# Patient Record
Sex: Male | Born: 1995 | Race: White | Hispanic: No | Marital: Single | Smoking: Former smoker
Health system: Southern US, Community
[De-identification: ages and names within clinical notes are randomized; demographics above are authoritative.]

## PROBLEM LIST (undated history)

## (undated) DIAGNOSIS — F329 Major depressive disorder, single episode, unspecified: Secondary | ICD-10-CM

## (undated) DIAGNOSIS — F32A Depression, unspecified: Secondary | ICD-10-CM

## (undated) DIAGNOSIS — F319 Bipolar disorder, unspecified: Secondary | ICD-10-CM

## (undated) DIAGNOSIS — R001 Bradycardia, unspecified: Secondary | ICD-10-CM

## (undated) DIAGNOSIS — I491 Atrial premature depolarization: Secondary | ICD-10-CM

## (undated) DIAGNOSIS — F419 Anxiety disorder, unspecified: Secondary | ICD-10-CM

## (undated) DIAGNOSIS — J309 Allergic rhinitis, unspecified: Secondary | ICD-10-CM

## (undated) DIAGNOSIS — R002 Palpitations: Secondary | ICD-10-CM

## (undated) HISTORY — DX: Bradycardia, unspecified: R00.1

## (undated) HISTORY — DX: Atrial premature depolarization: I49.1

## (undated) HISTORY — PX: FRACTURE SURGERY: SHX138

## (undated) HISTORY — DX: Palpitations: R00.2

## (undated) HISTORY — DX: Allergic rhinitis, unspecified: J30.9

---
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
## 2016-04-02 ENCOUNTER — Ambulatory Visit
Admission: RE | Admit: 2016-04-02 | Discharge: 2016-04-02 | Disposition: A | Discharge status: Home / Self Care | Payer: BLUE CROSS/BLUE SHIELD | Source: Ambulatory Visit | Attending: Family Medicine | Admitting: Family Medicine

## 2016-04-02 ENCOUNTER — Other Ambulatory Visit: Payer: Self-pay | Admitting: Family Medicine

## 2016-04-02 DIAGNOSIS — T1490XA Injury, unspecified, initial encounter: Secondary | ICD-10-CM

## 2016-05-21 ENCOUNTER — Encounter (HOSPITAL_BASED_OUTPATIENT_CLINIC_OR_DEPARTMENT_OTHER): Payer: Self-pay

## 2016-05-21 ENCOUNTER — Emergency Department (HOSPITAL_BASED_OUTPATIENT_CLINIC_OR_DEPARTMENT_OTHER)
Admission: EM | Admit: 2016-05-21 | Discharge: 2016-05-21 | Disposition: A | Discharge status: Other Acute Inpatient Hospital | Payer: BLUE CROSS/BLUE SHIELD | Attending: Emergency Medicine | Admitting: Emergency Medicine

## 2016-05-21 ENCOUNTER — Encounter (HOSPITAL_COMMUNITY): Payer: Self-pay

## 2016-05-21 ENCOUNTER — Inpatient Hospital Stay (HOSPITAL_COMMUNITY)
Admission: AD | Admit: 2016-05-21 | Discharge: 2016-05-26 | DRG: 885 | Disposition: A | Discharge status: Home / Self Care | Payer: BLUE CROSS/BLUE SHIELD | Source: Intra-hospital | Attending: Psychiatry | Admitting: Psychiatry

## 2016-05-21 DIAGNOSIS — T1491 Suicide attempt: Secondary | ICD-10-CM | POA: Diagnosis not present

## 2016-05-21 DIAGNOSIS — T1491XA Suicide attempt, initial encounter: Secondary | ICD-10-CM

## 2016-05-21 DIAGNOSIS — F17210 Nicotine dependence, cigarettes, uncomplicated: Secondary | ICD-10-CM | POA: Diagnosis present

## 2016-05-21 DIAGNOSIS — R112 Nausea with vomiting, unspecified: Secondary | ICD-10-CM | POA: Diagnosis not present

## 2016-05-21 DIAGNOSIS — F411 Generalized anxiety disorder: Secondary | ICD-10-CM | POA: Diagnosis present

## 2016-05-21 DIAGNOSIS — F1410 Cocaine abuse, uncomplicated: Secondary | ICD-10-CM | POA: Diagnosis present

## 2016-05-21 DIAGNOSIS — Z79899 Other long term (current) drug therapy: Secondary | ICD-10-CM | POA: Insufficient documentation

## 2016-05-21 DIAGNOSIS — F329 Major depressive disorder, single episode, unspecified: Secondary | ICD-10-CM | POA: Diagnosis present

## 2016-05-21 DIAGNOSIS — F1210 Cannabis abuse, uncomplicated: Secondary | ICD-10-CM | POA: Diagnosis present

## 2016-05-21 DIAGNOSIS — T50901A Poisoning by unspecified drugs, medicaments and biological substances, accidental (unintentional), initial encounter: Secondary | ICD-10-CM | POA: Diagnosis present

## 2016-05-21 DIAGNOSIS — F322 Major depressive disorder, single episode, severe without psychotic features: Principal | ICD-10-CM | POA: Diagnosis present

## 2016-05-21 DIAGNOSIS — T50904A Poisoning by unspecified drugs, medicaments and biological substances, undetermined, initial encounter: Secondary | ICD-10-CM | POA: Diagnosis not present

## 2016-05-21 DIAGNOSIS — G4700 Insomnia, unspecified: Secondary | ICD-10-CM | POA: Diagnosis present

## 2016-05-21 DIAGNOSIS — R45851 Suicidal ideations: Secondary | ICD-10-CM | POA: Diagnosis present

## 2016-05-21 DIAGNOSIS — T424X2A Poisoning by benzodiazepines, intentional self-harm, initial encounter: Secondary | ICD-10-CM | POA: Diagnosis not present

## 2016-05-21 DIAGNOSIS — T405X2A Poisoning by cocaine, intentional self-harm, initial encounter: Secondary | ICD-10-CM | POA: Diagnosis not present

## 2016-05-21 HISTORY — DX: Anxiety disorder, unspecified: F41.9

## 2016-05-21 HISTORY — DX: Major depressive disorder, single episode, unspecified: F32.9

## 2016-05-21 HISTORY — DX: Depression, unspecified: F32.A

## 2016-05-21 HISTORY — DX: Bipolar disorder, unspecified: F31.9

## 2016-05-21 LAB — COMPREHENSIVE METABOLIC PANEL
ALBUMIN: 4.7 g/dL (ref 3.5–5.0)
ALK PHOS: 70 U/L (ref 38–126)
ALT: 15 U/L — ABNORMAL LOW (ref 17–63)
ANION GAP: 8 (ref 5–15)
AST: 21 U/L (ref 15–41)
BILIRUBIN TOTAL: 1 mg/dL (ref 0.3–1.2)
BUN: 13 mg/dL (ref 6–20)
CALCIUM: 9.3 mg/dL (ref 8.9–10.3)
CO2: 28 mmol/L (ref 22–32)
Chloride: 101 mmol/L (ref 101–111)
Creatinine, Ser: 0.99 mg/dL (ref 0.61–1.24)
GFR calc Af Amer: >60 mL/min (ref >60)
GFR calc non Af Amer: >60 mL/min (ref >60)
GLUCOSE: 99 mg/dL (ref 65–99)
POTASSIUM: 3.8 mmol/L (ref 3.5–5.1)
SODIUM: 137 mmol/L (ref 135–145)
TOTAL PROTEIN: 7.7 g/dL (ref 6.5–8.1)

## 2016-05-21 LAB — CBC
HEMATOCRIT: 42.1 % (ref 39.0–52.0)
HEMOGLOBIN: 14.5 g/dL (ref 13.0–17.0)
MCH: 28.7 pg (ref 26.0–34.0)
MCHC: 34.4 g/dL (ref 30.0–36.0)
MCV: 83.2 fL (ref 78.0–100.0)
Platelets: 225 10*3/uL (ref 150–400)
RBC: 5.06 MIL/uL (ref 4.22–5.81)
RDW: 13.2 % (ref 11.5–15.5)
WBC: 9.5 10*3/uL (ref 4.0–10.5)

## 2016-05-21 LAB — RAPID URINE DRUG SCREEN, HOSP PERFORMED
Amphetamines: NOT DETECTED
BARBITURATES: NOT DETECTED
BENZODIAZEPINES: NOT DETECTED
COCAINE: POSITIVE — AB
Opiates: NOT DETECTED
TETRAHYDROCANNABINOL: POSITIVE — AB

## 2016-05-21 LAB — ACETAMINOPHEN LEVEL

## 2016-05-21 LAB — ETHANOL: Alcohol, Ethyl (B): <5 mg/dL (ref <5)

## 2016-05-21 LAB — SALICYLATE LEVEL: Salicylate Lvl: <4 mg/dL (ref 2.8–30.0)

## 2016-05-21 MED ORDER — ALUM & MAG HYDROXIDE-SIMETH 200-200-20 MG/5ML PO SUSP: 30.0000 mL | ORAL | Status: DC | PRN

## 2016-05-21 MED ORDER — ACETAMINOPHEN 325 MG PO TABS: 650.0000 mg | ORAL_TABLET | Freq: Four times a day (QID) | ORAL | Status: DC | PRN

## 2016-05-21 MED ORDER — HYDROXYZINE HCL 25 MG PO TABS
25.0000 mg | ORAL_TABLET | Freq: Four times a day (QID) | ORAL | Status: DC | PRN
  Administered 2016-05-22 – 2016-05-25 (×4): 25 mg via ORAL
  Filled 2016-05-21 (×4): qty 1

## 2016-05-21 MED ORDER — ENSURE ENLIVE PO LIQD
237.0000 mL | Freq: Two times a day (BID) | ORAL | Status: DC
  Administered 2016-05-22 – 2016-05-25 (×5): 237 mL via ORAL

## 2016-05-21 MED ORDER — MAGNESIUM HYDROXIDE 400 MG/5ML PO SUSP: 30.0000 mL | Freq: Every day | ORAL | Status: DC | PRN

## 2016-05-21 NOTE — Tx Team (Signed)
Initial Interdisciplinary Treatment Plan   PATIENT STRESSORS: Educational concerns Substance abuse   PATIENT STRENGTHS: Wellsite geologistCommunication skills General fund of knowledge Physical Health   PROBLEM LIST: Problem List/Patient Goals Date to be addressed Date deferred Reason deferred Estimated date of resolution  Depression 05/21/16     Anxiety 05/21/16     Substance abuse 05/21/16     "I want to be able to balance my life and not have it revolve around drugs" 05/21/16     "I really don't want to stop smoking weed but just smoke less" 05/21/16                              DISCHARGE CRITERIA:  Improved stabilization in mood, thinking, and/or behavior Verbal commitment to aftercare and medication compliance  PRELIMINARY DISCHARGE PLAN: Outpatient therapy Medication management  PATIENT/FAMIILY INVOLVEMENT: This treatment plan has been presented to and reviewed with the patient, Mark Kirk.  The patient and family have been given the opportunity to ask questions and make suggestions.  Norm ParcelHeather V Markee Matera 05/21/2016, 10:36 PM

## 2016-05-21 NOTE — Progress Notes (Signed)
Mark Kirk is a 20 y.o. male being admitted involuntarily to [facility] from  [facility].  He has recently been diagnosed with bipolar disorder and not taking medication.  He recently  just quit his job and dropped out of school because he could no longer handle the pressure.  Pt stated also that his sadness and anxiety were so severe that on or about 05/20/16, he overdosed on [address]-bought Klonopin pills and also smoked marijuana and used cocaine.  This is his first suicide attempt but stated that he just couldn't handle things and didn't really want to die.  Pt stated that he feels sad, anxious, has poor appetite, anhedonia, and feelings of hopelessness.  Pt also endorsed daily use of marijuana and rare use of cocaine and alcohol.  He refused to sign in voluntarily and had to be IVC'd.  Admission paperwork completed and signed.  Pt came in with no belongings.  Skin assessment completed and noted right hand surgery scar and facial acne.  Q 15 minute checks initiated for safety.  We will monitor the progress towards his goals.

## 2016-05-21 NOTE — ED Notes (Signed)
[facility] Tor[facility]; [facility] notified for transport

## 2016-05-21 NOTE — ED Notes (Signed)
Gave mom and dad visiting hours and rule about pt needing to give them a code to visit and they verbalize understanding.   Oneal GroutDad, Todd: [phone] Milon DikesMom, Sandra: [phone] Pt's psychiatrist Dr. Len Blalockavid Fuller: [phone] And counselor Loralie ChampagneAmanda Kirby: [phone]

## 2016-05-21 NOTE — ED Notes (Signed)
Pt [facility]

## 2016-05-21 NOTE — ED Notes (Signed)
Mom informed nurse that pt has a recent diagnosis of bipolar disorder and was put on medication, but elected to stop the medication over a week ago.

## 2016-05-21 NOTE — ED Notes (Signed)
Pt wanded by security. 

## 2016-05-21 NOTE — ED Notes (Signed)
[facility]

## 2016-05-21 NOTE — ED Notes (Signed)
Dr. Cyndie ChimeNguyen at bedside to assess patient. Pt refused to speak to doctor after being informed he was being admitted

## 2016-05-21 NOTE — BH Assessment (Signed)
[facility] Assessment Progress Note     [facility] once medically cleared as indicated in notes.

## 2016-05-21 NOTE — ED Notes (Signed)
MD at bedside. 

## 2016-05-21 NOTE — ED Notes (Addendum)
Patient complains of ongoing depression and on Monday took Klonopin 20 tablets and powder cocaine in attempt to hurt himself. Has had similar thoughts in past.  Denies any ingestion today. Alert with flat affect. Has vomited several times since monday

## 2016-05-21 NOTE — ED Provider Notes (Signed)
CSN: [id number]     Arrival date & time 05/21/16  1506 History   First MD Initiated Contact with Patient 05/21/16 1524     Chief Complaint  Patient presents with  . Suicidal  . Ingestion    HPI Comments: Patient presents with complaint of active suicidal ideation. Patient states that he has been having thoughts of hurting himself and depression for greater than one week. Two days ago patient states that he intentionally took 20 Klonopin tablets of unknown dosage as well as cocaine and attempt to harm himself. Patient states that he slept for a while after taking these and then woke up and vomited several times. He did not combine this with alcohol. No ingestions yesterday or today. Patient otherwise does not voice any medical complaints. He states that he thinks he has tried to harm himself in the past but cannot give details regarding when or how. He states that he wants help with everything that he is going through.   Patient is a 20 y.o. male presenting with Ingested Medication. The history is provided by the patient.  Ingestion Associated symptoms include nausea and vomiting. Pertinent negatives include no abdominal pain, chest pain, coughing, fever, headaches, myalgias, rash or sore throat.    Past Medical History  Diagnosis Date  . Depression   . Bipolar 1 disorder (HCC)    History reviewed. No pertinent past surgical history. No family history on file. Social History  Substance Use Topics  . Smoking status: Current Every Day Smoker    Types: Cigarettes  . Smokeless tobacco: None  . Alcohol Use: Yes    Review of Systems  Constitutional: Negative for fever.  HENT: Negative for rhinorrhea and sore throat.   Eyes: Negative for redness.  Respiratory: Negative for cough.   Cardiovascular: Negative for chest pain.  Gastrointestinal: Positive for nausea and vomiting. Negative for abdominal pain and diarrhea.  Genitourinary: Negative for dysuria.  Musculoskeletal: Negative for  myalgias.  Skin: Negative for rash.  Neurological: Negative for headaches.  Psychiatric/Behavioral: Positive for suicidal ideas.    Allergies  Review of patient's allergies indicates no known allergies.  Home Medications   Prior to Admission medications   Medication Sig Start Date End Date Taking? Authorizing Provider  risperiDONE (RISPERDAL) 0.5 MG tablet Take 0.5 mg by mouth 2 (two) times daily.   Yes Historical Provider, MD   BP 123/76 mmHg  Pulse 64  Temp(Src) 98.6 F (37 C) (Oral)  Resp 19  Ht  (1.753 m)  Wt 68.04 kg  BMI 22.14 kg/m2  SpO2 100%   Physical Exam  Constitutional: He appears well-developed and well-nourished.  HENT:  Head: Normocephalic and atraumatic.  Mouth/Throat: Oropharynx is clear and moist.  Eyes: Conjunctivae are normal. Right eye exhibits no discharge. Left eye exhibits no discharge.  Neck: Normal range of motion. Neck supple.  Cardiovascular: Normal rate, regular rhythm and normal heart sounds.   Pulmonary/Chest: Effort normal and breath sounds normal. No respiratory distress. He has no wheezes. He has no rales.  Abdominal: Soft. There is no tenderness.  Neurological: He is alert.  Skin: Skin is warm and dry.  Psychiatric: He has a normal mood and affect.  Nursing note and vitals reviewed.   ED Course  Procedures (including critical care time) Labs Review Labs Reviewed  URINE RAPID DRUG SCREEN, HOSP PERFORMED - Abnormal; Notable for the following:    Cocaine POSITIVE (*)    Tetrahydrocannabinol POSITIVE (*)    All other components within normal  limits  CBC  COMPREHENSIVE METABOLIC PANEL  ETHANOL  SALICYLATE LEVEL  ACETAMINOPHEN LEVEL     3:34 PM Patient seen and examined. Work-up initiated. Holding orders completed. Will need TTS eval. Patient is currently voluntary.   Vital signs reviewed and are as follows: BP 123/76 mmHg  Pulse 64  Temp(Src) 98.6 F (37 C) (Oral)  Resp 19  Ht 5\' 9"  (1.753 m)  Wt 68.04 kg  BMI 22.14  kg/m2  SpO2 100%  4:05 PM Handoff to Dr. Cyndie ChimeNguyen at shift change. Pending TTS eval.   MDM   Final diagnoses:  Suicide attempt [facility](HCC)   Pending TTS eval.    Renne CriglerJoshua Ashlley Booher, PA-C 05/24/16 2007  Leta BaptistEmily Roe Nguyen, MD 05/26/16 2314

## 2016-05-21 NOTE — ED Notes (Addendum)
TTS consult in process via telephone with [facility]

## 2016-05-21 NOTE — ED Notes (Signed)
PA at bedside.

## 2016-05-21 NOTE — BH Assessment (Signed)
Tele Assessment Note   Mark Kirk is a 20 y.o. male who presented to [facility] on a voluntary basis after intentionally overdosing on about [address]-purchased Klonipin with intent to "really hurt myself or die."  The assessment was conducted by phone as the telepsych machine could not be activated.  As a result, appearance, motor activity, eye contact could not be evaluated.  Pt reported as follows:  "For as long as I've been alive, I've had anxiety."  Pt stated that he was, until recently, enrolled at [employer] and employed at [employer].  Pt said he just quit his job and dropped out of school because he could no longer handle the pressure.  Pt stated also that his sadness and anxiety were so severe that on or about 05/20/16, he overdosed on [address]-bought Klonipin pills and also smoked marijuana and used cocaine.  Per Pt, this is his first suicide attempt.  "I don't really know if I was trying to kill myself, but I really was trying to hurt myself real bad."  Pt stated that he feels sad, anxious, has poor appetite, improving sleep ("I used to not be able to sleep at all"), loss of enjoyment/pleasure, and feelings of hopelessness.  Pt also endorsed daily use of marijuana and rare use of cocaine and alcohol.  In addition to his overdose, Pt reported a history of self-injury, including a recent episode of punching a cooler and breaking his wrist.  When asked about triggers for his overdose, Pt reported, "A whole lot of internal triggers ... Just the way I feel."  Pt lives with his parents.  He reported that he takes Risperidone prescribed by his psychiatrist, Dr. Toni Arthurs.  He denied any previous hospitalizations.  During assessment, Pt was cooperative.  He reported mood as depressed and affect was congruent.  Pt endorsed suicidal ideation (reckless ideation to dying combined with desire to cause significant injury) and other depressive symptoms (see above).  Pt also endorsed constant anxiety with panic  attacks.  Pt endorsed daily use of marijuana and rare use of cocaine and alcohol.  Speech was normal in rate, rhythm, and volume.  Memory and concentration were intact.  Thought processes were within normal limits and thought content was consistent with suicidal ideation.  Impulse control, judgment, and insight were deemed poor as evidenced by recent overdose.  Consulted with A. Nwoko, who determined that Pt meets inpatient criteria.  Consulted with [facility] RN -- Pt to be admitted to [facility] 404-1 once medically cleared.   Diagnosis: Major Depressive Disorder, Single Ep., Severe, with Anxious Features  Past Medical History:  Past Medical History  Diagnosis Date  . Depression   . Bipolar 1 disorder (HCC)   . Anxiety     History reviewed. No pertinent past surgical history.  Family History: No family history on file.  Social History:  reports that he has been smoking Cigarettes.  He does not have any smokeless tobacco history on file. He reports that he drinks alcohol. He reports that he uses illicit drugs (Cocaine and Marijuana).  Additional Social History:  Alcohol / Drug Use Pain Medications: See PTA Prescriptions: See PTA Over the Counter: See PTA History of alcohol / drug use?: Yes Substance #1 Name of Substance 1: Marijuana 1 - Age of First Use: 15 1 - Amount (size/oz): 1/8 gram 1 - Frequency: Daily 1 - Duration: Ongoing 1 - Last Use / Amount: 05/20/16 Substance #2 Name of Substance 2: Cocaine 2 - Amount (size/oz): "a bump"  2 - Frequency: Rarely 2 - Last Use / Amount: 05/20/16 Substance #3 Name of Substance 3: Alcohol 3 - Amount (size/oz): "a couple of beers" 3 - Last Use / Amount: 05/20/16  CIWA: CIWA-Ar BP: 123/76 mmHg Pulse Rate: 64 COWS:    PATIENT STRENGTHS: (choose at least two) Average or above average intelligence Capable of independent living Communication skills  Allergies: No Known Allergies  Home Medications:  (Not in a hospital admission)  OB/GYN Status:   No LMP for male patient.  General Assessment Data [facility] TTS Assessment: In system Is this a Tele or Face-to-Face Assessment?: Tele Assessment Is this an Initial Assessment or a Re-assessment for this encounter?: Initial Assessment Marital status: Married Is patient pregnant?: No Pregnancy Status: No Living Arrangements: Parent Can pt return to current living arrangement?: Yes Admission Status: Voluntary Is patient capable of signing voluntary admission?: Yes Referral Source: Self/Family/Friend Insurance type: BCBS  Medical Screening Exam [facility] Walk[facility]in ONLY) Medical Exam completed: Yes  Crisis Care Plan Living Arrangements: Parent Name of Psychiatrist: Dr. Toni ArthursFuller Name of Therapist: Loralie Champagnemanda Kirby  Education Status Is patient currently in school?: No  Risk to self with the past 6 months Suicidal Ideation: Yes-Currently Present Has patient been a risk to self within the past 6 months prior to admission? : No Suicidal Intent: Yes-Currently Present Has patient had any suicidal intent within the past 6 months prior to admission? : No Is patient at risk for suicide?: Yes Suicidal Plan?: Yes-Currently Present Has patient had any suicidal plan within the past 6 months prior to admission? : No Specify Current Suicidal Plan: Pt overdosed on 20 Kl[address] on [address] (Also used THC, Cocaine) Access to Means: Yes Specify Access to Suicidal Means: Bought Klonipin off street What has been your use of drugs/alcohol within the last 12 months?: Cocaine, Marijuana, Alcohol Previous Attempts/Gestures: No Other Self Harm Risks: Drug use, self-injury Intentional Self Injurious Behavior: Damaging Comment - Self Injurious Behavior: Pt punched cooler, breaking wrist Family Suicide History: Unknown Recent stressful life event(s): Turmoil (Comment) (Overwhelmed at work and school) Persecutory voices/beliefs?: No Depression:  Yes Depression Symptoms: Despondent, Insomnia, Feeling worthless/self pity Substance abuse history and/or treatment for substance abuse?: Yes Suicide prevention information given to non-admitted patients: Not applicable  Risk to Others within the past 6 months Homicidal Ideation: No Does patient have any lifetime risk of violence toward others beyond the six months prior to admission? : No Thoughts of Harm to Others: No Current Homicidal Intent: No Current Homicidal Plan: No Access to Homicidal Means: No History of harm to others?: No Assessment of Violence: None Noted Does patient have access to weapons?: No Criminal Charges Pending?: No Does patient have a court date: No Is patient on probation?: No  Psychosis Hallucinations: None noted Delusions: None noted  Mental Status Report Appearance/Hygiene: Unable to Assess Eye Contact: Unable to Assess Motor Activity: Unable to assess Speech: Logical/coherent, Unremarkable Level of Consciousness: Alert Mood: Depressed Affect: Appropriate to circumstance Anxiety Level: Moderate Thought Processes: Coherent, Relevant Judgement: Impaired Orientation: Place, Person, Time, Situation Obsessive Compulsive Thoughts/Behaviors: None  Cognitive Functioning Concentration: Normal Memory: Recent Intact, Remote Intact IQ: Average Insight: Poor Impulse Control: Poor Appetite: Poor Sleep: No Change Vegetative Symptoms: Staying in bed  ADLScreening [facility] Assessment Services) Patient's cognitive ability adequate to safely complete daily activities?: Yes Patient able to express need for assistance with ADLs?: Yes Independently performs ADLs?: Yes (appropriate for developmental age)  Prior Inpatient Therapy Prior Inpatient  Therapy: No  Prior Outpatient Therapy Prior Outpatient Therapy: Yes Prior Therapy Dates: Ongoing Prior Therapy Facilty/Provider(s): Loralie Champagne Reason for Treatment: Depression, Anxiety Does patient have an ACCT  team?: No Does patient have Intensive In-House Services?  : No Does patient have [facility] services? : No Does patient have P4CC services?: No  ADL Screening (condition at time of admission) Patient's cognitive ability adequate to safely complete daily activities?: Yes Is the patient deaf or have difficulty hearing?: No Does the patient have difficulty seeing, even when wearing glasses/contacts?: No Does the patient have difficulty concentrating, remembering, or making decisions?: No Patient able to express need for assistance with ADLs?: Yes Does the patient have difficulty dressing or bathing?: No Independently performs ADLs?: Yes (appropriate for developmental age) Does the patient have difficulty walking or climbing stairs?: No Weakness of Legs: None Weakness of Arms/Hands: None  Home Assistive Devices/Equipment Home Assistive Devices/Equipment: None  Therapy Consults (therapy consults require a physician order) PT Evaluation Needed: No OT Evalulation Needed: No SLP Evaluation Needed: No Abuse/Neglect Assessment (Assessment to be complete while patient is alone) Physical Abuse: Denies Verbal Abuse: Denies Sexual Abuse: Denies Exploitation of patient/patient's resources: Denies Self-Neglect: Denies Values / Beliefs Cultural Requests During Hospitalization: None Spiritual Requests During Hospitalization: None Consults Spiritual Care Consult Needed: No Social Work Consult Needed: No Merchant navy officer (For Healthcare) Does patient have an advance directive?: No Would patient like information on creating an advanced directive?: No - patient declined information    Additional Information 1:1 In Past 12 Months?: No CIRT Risk: No Elopement Risk: No Does patient have medical clearance?: Yes     Disposition:  Disposition Initial Assessment Completed for this Encounter: Yes Disposition of Patient: Inpatient treatment program Type of inpatient treatment program: Adult (Per  A Nwoko, Pt meets inpt criteria)  Dorris Fetch Neely Cecena 05/21/2016 5:24 PM

## 2016-05-21 NOTE — ED Provider Notes (Signed)
Patient seen and evaluated at bedside. Patient reporting that he took medications on purpose to harm himself and that he knew that this could kill him. Patient medically cleared for inpatient psychiatric treatment. When patient was told he would be going for inpatient care he said that he did not want to go and was refusing to go. He did acquiesce but only and said that he would because he was stuck. There was concern that if he was transported without an IVC in place that he may try to take off and not go to the inpatient psych facility. For this reason IVC paperwork was filled out for the patient's safety.  Leta BaptistEmily Roe Louine Tenpenny, MD 05/21/16 [phone]

## 2016-05-22 ENCOUNTER — Encounter (HOSPITAL_COMMUNITY): Payer: Self-pay | Admitting: Psychiatry

## 2016-05-22 DIAGNOSIS — T50904A Poisoning by unspecified drugs, medicaments and biological substances, undetermined, initial encounter: Secondary | ICD-10-CM

## 2016-05-22 DIAGNOSIS — T50901A Poisoning by unspecified drugs, medicaments and biological substances, accidental (unintentional), initial encounter: Secondary | ICD-10-CM | POA: Diagnosis present

## 2016-05-22 DIAGNOSIS — F329 Major depressive disorder, single episode, unspecified: Secondary | ICD-10-CM

## 2016-05-22 MED ORDER — NICOTINE POLACRILEX 2 MG MT GUM
2.0000 mg | CHEWING_GUM | OROMUCOSAL | Status: DC | PRN
  Administered 2016-05-22 – 2016-05-25 (×8): 2 mg via ORAL
  Filled 2016-05-22: qty 1
  Filled 2016-05-22: qty 5
  Filled 2016-05-22 (×4): qty 1

## 2016-05-22 MED ORDER — TRAZODONE HCL 50 MG PO TABS
50.0000 mg | ORAL_TABLET | Freq: Every evening | ORAL | Status: DC | PRN
  Administered 2016-05-22 – 2016-05-25 (×4): 50 mg via ORAL
  Filled 2016-05-22 (×3): qty 1

## 2016-05-22 MED ORDER — CITALOPRAM HYDROBROMIDE 10 MG PO TABS
10.0000 mg | ORAL_TABLET | Freq: Every day | ORAL | Status: DC
  Administered 2016-05-22 – 2016-05-26 (×5): 10 mg via ORAL
  Filled 2016-05-22 (×8): qty 1

## 2016-05-22 NOTE — BHH Counselor (Signed)
Adult Comprehensive Assessment  Patient ID: Mark Kirk, male   DOB: 07/03/1996, 20 y.o.   MRN: [id number]  Information Source: Information source: Patient  Current Stressors:  Educational / Learning stressors:  (Completed 12th grade and 6 months of [employer]. ) Employment / Job issues:  (Recently lost his jib at [employer] which caused a great deal of stress) Family Relationships:  (Relationships with family has been strained due to drug use) Surveyor, quantityinancial / Lack of resources (include bankruptcy):  (Only responsible for gas in vehicle and phone bill) Housing / Lack of housing:  (Lives with both parents) Physical health (include injuries & life threatening diseases):  (Recently broke his wrist) Social relationships:  (Has 4 strong frienships outside of his family) Substance abuse:  (Chronic marijuana use) Bereavement / Loss:  (N/A)  Living/Environment/Situation:  Living Arrangements: Parent Living conditions (as described by patient or guardian):  (Family dynamic has gotten better, therapy has helped strengthen relationships) How long has patient lived in current situation?: Since birth, 20 years What is atmosphere in current home: Loving, Supportive  Family History:  Marital status: Single (Single) What is your sexual orientation?:  (Straight) Has your sexual activity been affected by drugs, alcohol, medication, or emotional stress?:  (N/A) Does patient have children?: No  Childhood History:  By whom was/is the patient raised?: Both parents Additional childhood history information:  (N/A) Description of patient's relationship with caregiver when they were a child:  (Always had a very close relationship with family members. Dad worked a lot when pt was a child but still active. Mom stayed at home and helped around the house and for sporting events.) Patient's description of current relationship with people who raised him/her: Family is closer now, they are able to talk about situations that were  once chaotic or hard to discuss. How were you disciplined when you got in trouble as a child/adolescent?: Had things taken away, put on punishment  Does patient have siblings?: Yes Number of Siblings: 1 Description of patient's current relationship with siblings: Sister and pt has an extremely close reationship Did patient suffer any verbal/emotional/physical/sexual abuse as a child?: No Did patient suffer from severe childhood neglect?: No Has patient ever been sexually abused/assaulted/raped as an adolescent or adult?: No Was the patient ever a victim of a crime or a disaster?: No Witnessed domestic violence?: No Has patient been effected by domestic violence as an adult?: No  Education:  Highest grade of school patient has completed:  (Completed 12th grade and a semester of college) Currently a Consulting civil engineerstudent?: No Learning disability?: No  Employment/Work Situation:   Employment situation: Unemployed Patient's job has been impacted by current illness: No What is the longest time patient has a held a job?:  (One year and a half) Where was the patient employed at that time?: [employer] Has patient ever been in the Eli Lilly and Companymilitary?: No Has patient ever served in combat?: No Did You Receive Any Psychiatric Treatment/Services While in Equities traderthe Military?: No Are There Guns or Other Weapons in Your Home?: No Are These ComptrollerWeapons Safely Secured?: Yes (N/A) Who Could Verify You Are Able To Have These Secured:: N/A  Financial Resources:   Financial resources: Support from parents / caregiver Does patient have a Lawyerrepresentative payee or guardian?:  (Mother and father are considered pt's guardian)  Alcohol/Substance Abuse:   What has been your use of drugs/alcohol within the last 12 months?:  (Cocaine (2-3 times), Marijuana (daily), acid (3 times), drink beer (once a week), xanax (7 or 8 times))  If attempted suicide, did drugs/alcohol play a role in this?: Yes Alcohol/Substance Abuse Treatment Hx: Denies past  history If yes, describe treatment:  (N/A) Has alcohol/substance abuse ever caused legal problems?: Yes (Was caught selling marijuana but was not charged )  Social Support System:   Patient's Community Support System: Production assistant, radio System:  (Has supportive neighbors and friends in the community) Type of faith/religion:  (N/A) How does patient's faith help to cope with current illness?:  (N/A)  Leisure/Recreation:   Leisure and Hobbies: Use to play soccer, enjoys listening to music  Strengths/Needs:   What things does the patient do well?: Rapping, soccer, good with hands  In what areas does patient struggle / problems for patient: Procrastination in school and responsibilities. Internal struggle such as mood swings  Discharge Plan:   Does patient have access to transportation?: Yes Will patient be returning to same living situation after discharge?: Yes Currently receiving community mental health services: Yes (From Whom) (Dr. Toni Arthurs and Loralie Champagne at [place] Does patient have financial barriers related to discharge medications?: No  Summary/Recommendations:   Summary and Recommendations (to be completed by the evaluator): Mark Kirk is a 20 y.o, caucasian male diagnosed with Major Depressive Disorder, Single Ep., Severe, with Anxious Features. [name] was admitted on a voluntary basis after intentionally overdosing on about 20 street-purchased Klonipin with intent to "really hurt myself or die." Patient has a therapist and psychiatrist that he sees on a regular basis and will continue treatment with that agency at discharge. Patient will benefit from crisis stabilization,medication evaluation, group therapy, and psycho education in addition to case management for discharge planning. Patient and CSW reviewed pt's identified goals and treatment plan. Pt verbalized understanding and agreed to treatment plan. At discharge it is recommended that patient  remain compliant with established plan and continue treatment.  Mark Gerald B. 05/22/2016

## 2016-05-22 NOTE — Progress Notes (Signed)
Patient ID: Nile Riggsndrew Toor, male   DOB: 10/25/1996, 20 y.o.   MRN: [id number]  Adult Psychoeducational Group Note  Date:  05/22/2016 Time:  09:00am  Group Topic/Focus:  Crisis Planning:   The purpose of this group is to help patients create a crisis plan for use upon discharge or in the future, as needed.  Participation Level:  Did Not Attend  Participation Quality:  n/a  Affect:  n/a  Cognitive: n/a  Insight: n/a  Engagement in Group: n/a  Modes of Intervention:  Activity, Discussion, Education and Support  Additional Comments:  Pt did not attend group, pt in bed asleep.   Aurora Maskwyman, Pio Eatherly E 05/22/2016, 9:58 AM

## 2016-05-22 NOTE — B[facility] Suicide Risk Assessment (Signed)
[facility]   Nursing information obtained from:  Patient Demographic factors:  Male, Caucasian, Unemployed Current Mental Status:  Self-harm behaviors Loss Factors:  Decrease in vocational status Historical Factors:  Prior suicide attempts, Family history of mental illness or substance abuse, Impulsivity Risk Reduction Factors:  Living with another person, especially a relative, Positive social support  Total Time spent with patient: 45 minutes Principal Problem: Depression , Substance Abuse  Diagnosis:   Patient Active Problem List   Diagnosis Date Noted  . Major depressive disorder (HCC) [F32.9] 05/21/2016     Continued Clinical Symptoms:  Alcohol Use Disorder Identification Test Final Score (AUDIT): 3 The "Alcohol Use Disorders Identification Test", Guidelines for Use in Primary Care, Second Edition.  World Science writerHealth Organization [facility](WHO). Score between 0-7:  no or low risk or alcohol related problems. Score between 8-15:  moderate risk of alcohol related problems. Score between 16-19:  high risk of alcohol related problems. Score 20 or above:  warrants further diagnostic evaluation for alcohol dependence and treatment.   CLINICAL FACTORS:  20 year old single male,  Reports history of depression, substance abuse, and states he feels he has not been doing well recently. Recently withdrew from college and quit his job. Reports family has been concerned about him, his level of functioning . He recently  Impulsively  Overdosed   on BZD and Cocaine .      Psychiatric Specialty Exam: Physical Exam  ROS  Blood pressure 117/89, pulse 60, temperature 98.5 F (36.9 C), temperature source Oral, resp. rate 16, height 5\' 9"  (1.753 m), weight 150 lb (68.04 kg), SpO2 100 %.Body mass index is 22.14 kg/(m^2).   see admit note MSE    COGNITIVE FEATURES THAT CONTRIBUTE TO RISK:  Decrease in level of functioning   SUICIDE RISK:   Moderate:  Frequent suicidal  ideation with limited intensity, and duration, some specificity in terms of plans, no associated intent, good self-control, limited dysphoria/symptomatology, some risk factors present, and identifiable protective factors, including available and accessible social support.  PLAN OF CARE: Patient will be admitted to inpatient psychiatric unit for stabilization and safety. Will provide and encourage milieu participation. Provide medication management and maked adjustments as needed.  Will follow daily.    I certify that inpatient services furnished can reasonably be expected to improve the patient's condition.   Nehemiah MassedOBOS, FERNANDO, MD 05/22/2016, 3:49 PM

## 2016-05-22 NOTE — BHH Group Notes (Signed)
BHH LCSW Group Therapy  05/22/2016 3:35 PM  Type of Therapy:  Group Therapy  Participation Level:  Active  Participation Quality:  Appropriate and Attentive  Affect:  Appropriate  Cognitive:  Alert and Appropriate  Insight:  Developing/Improving  Engagement in Therapy:  Developing/Improving  Modes of Intervention:  Discussion, Education, Exploration and Support  Summary of Progress/Problems:  [facility] Speaker came to talk about his personal journey with substance abuse and addiction. The pt processed ways by which to relate to the speaker. [facility] speaker provided handouts and educational information pertaining to groups and services offered by the [facility].   Ebb Carelock C 05/22/2016, 3:35 PM   

## 2016-05-22 NOTE — Progress Notes (Signed)
Nutrition Brief Note  Patient identified on the Malnutrition Screening Tool (MST) Report  Wt Readings from Last 15 Encounters:  05/21/16 150 lb (68.04 kg) (44 %*, Z = -0.15)  05/21/16 150 lb (68.04 kg) (44 %*, Z = -0.15)   * Growth percentiles are based on CDC 2-20 Years data.    Body mass index is 22.14 kg/(m^2). Patient meets criteria for normal range based on current BMI.   Diet Order: Diet regular Room service appropriate?: Yes; Fluid consistency:: Thin Labs and medications reviewed.   No nutrition interventions warranted at this time. If nutrition issues arise, please consult RD.   Tilda FrancoLindsey Veria Stradley, MS, RD, LDN Pager: [phone] After Hours Pager: [phone]

## 2016-05-22 NOTE — Progress Notes (Signed)
Mark Kirk rates Anxiety 6/10 and Depression 8/10. His goal today "was to not go insane from being confined within these white walls". Denies SI/HI/AVH. Encouragement and support given. Medications administered as prescribed. Continue Q 15 minute checks for patient safety and medication effectiveness.

## 2016-05-22 NOTE — BHH Suicide Risk Assessment (Signed)
BHH INPATIENT:  Family/Significant Other Suicide Prevention Education  Suicide Prevention Education:  Education Completed: Mom, Dois DavenportSandra [phone]) has been identified by the patient as the family member/significant other with whom the patient will be residing, and identified as the person(s) who will aid the patient in the event of a mental health crisis (suicidal ideations/suicide attempt).  With written consent from the patient, the family member/significant other has been provided the following suicide prevention education, prior to the and/or following the discharge of the patient.  The suicide prevention education provided includes the following:  Suicide risk factors  Suicide prevention and interventions  National Suicide Hotline telephone number  [facility] assessment telephone number  [facility] Emergency Assistance 911  [facility] and/or Residential Mobile Crisis Unit telephone number  Request made of family/significant other to:  Remove weapons (e.g., guns, rifles, knives), all items previously/currently identified as safety concern.    Remove drugs/medications (over-the-counter, prescriptions, illicit drugs), all items previously/currently identified as a safety concern.  The family member/significant other verbalizes understanding of the suicide prevention education information provided.  The family member/significant other agrees to remove the items of safety concern listed above.  Daryel Geraldorth, Alok Minshall B 05/22/2016, 3:53 PM

## 2016-05-22 NOTE — Tx Team (Signed)
Interdisciplinary Treatment Plan Update (Adult)  Date:  05/22/2016   Time Reviewed:  8:24 AM   Progress in Treatment: Attending groups: Yes. Participating in groups:  Yes. Taking medication as prescribed:  Yes. Tolerating medication:  Yes. Family/Significant other contact made:  Yes Mother Patient understands diagnosis:  Yes  As evidenced by seeking help with anxiety and substance abuse Discussing patient identified problems/goals with staff:  Yes, see initial care plan. Medical problems stabilized or resolved:  Yes. Denies suicidal/homicidal ideation: Yes. Issues/concerns per patient self-inventory:  No. Other:  New problem(s) identified:  Discharge Plan or Barriers: see below  Reason for Continuation of Hospitalization: Anxiety Suicidal ideation  Comments: Patient complains of ongoing depression and on Monday took Klonopin 20 tablets and powder cocaine in attempt to hurt himself. Has had similar thoughts in past.  We discussed options, agrees to antidepressant trial, start CELEXA 10 mgrs QDAY initially, side effects discussed   Estimated length of stay: 3-5 days  New goal(s):  Review of initial/current patient goals per problem list:   Review of initial/current patient goals per problem list:  1. Goal(s): Patient will participate in aftercare plan   Met: Yes   Target date: 3-5 days post admission date   As evidenced by: Patient will participate within aftercare plan AEB aftercare provider and housing plan at discharge being identified.  05/22/2016: Patient will return home with mother and follow up with current providers.  2. Goal (s): Patient will exhibit decreased depressive symptoms and suicidal ideations.   Met: No   Target date: 3-5 days post admission date   As evidenced by: Patient will utilize self rating of depression at 3 or below and demonstrate decreased signs of depression or be deemed stable for discharge by MD.  05/22/2016: Patient still  presents with depression symptoms. Depression rating score at a 6.   3. Goal(s): Patient will demonstrate decreased signs and symptoms of anxiety.   Met: No   Target date: 3-5 days post admission date   As evidenced by: Patient will utilize self rating of anxiety at 3 or below and demonstrated decreased signs of anxiety, or be deemed stable for discharge by MD  05/22/2016: Anxiety symptoms still present at this time. Anxiety score at a 7.   4. Goal(s): Patient will demonstrate decreased signs of withdrawal due to substance abuse   Met: Yes   Target date: 3-5 days post admission date   As evidenced by: Patient will produce a CIWA/COWS score of 0, have stable vitals signs, and no symptoms of withdrawal 05/22/16:  No signs nor symptoms of withdrawal today    Attendees: Patient:  05/22/2016 8:24 AM   Family:   05/22/2016 8:24 AM   Physician:  Ursula Alert, MD 05/22/2016 8:24 AM   Nursing:   Darrol Angel, RN 05/22/2016 8:24 AM   CSW:    Roque Lias, LCSW   05/22/2016 8:24 AM   Other:  05/22/2016 8:24 AM   Other:   05/22/2016 8:24 AM   Other:  Lars Pinks, Nurse CM 05/22/2016 8:24 AM   Other:   05/22/2016 8:24 AM   Other:  Norberto Sorenson, [place]  05/22/2016 8:24 AM   Other:  05/22/2016 8:24 AM   Other:  05/22/2016 8:24 AM   Other:  05/22/2016 8:24 AM   Other:  05/22/2016 8:24 AM   Other:  05/22/2016 8:24 AM   Other:   05/22/2016 8:24 AM    Scribe for Treatment Team:   Trish Mage, 05/22/2016 8:24 AM

## 2016-05-22 NOTE — BHH Counselor (Signed)
Adult Comprehensive Assessment  Patient ID: Mark Kirk, male   DOB: 07/04/1996, 20 y.o.   MRN: [id number]  Information Source: Information source: Patient  Current Stressors:  Educational / Learning stressors:  (Completed 12th grade and 6 months of [employer]. ) Employment / Job issues:  (Recently lost his jib at [employer] which caused a great deal of stress) Family Relationships:  (Relationships with family has been strained due to drug use) Surveyor, quantityinancial / Lack of resources (include bankruptcy):  (Only responsible for gas in vehicle and phone bill) Housing / Lack of housing:  (Lives with both parents) Physical health (include injuries & life threatening diseases):  (Recently broke his wrist) Social relationships:  (Has 4 strong frienships outside of his family) Substance abuse:  (Chronic marijuana use) Bereavement / Loss:  (N/A)  Living/Environment/Situation:  Living Arrangements: Parent Living conditions (as described by patient or guardian):  (Family dynamic has gotten better, therapy has helped strengthen relationships) How long has patient lived in current situation?: Since birth, 20 years What is atmosphere in current home: Loving, Supportive  Family History:  Marital status: Single (Single) What is your sexual orientation?:  (Straight) Has your sexual activity been affected by drugs, alcohol, medication, or emotional stress?:  (N/A) Does patient have children?: No  Childhood History:  By whom was/is the patient raised?: Both parents Additional childhood history information:  (N/A) Description of patient's relationship with caregiver when they were a child:  (Always had a very close relationship with family members. Dad worked a lot when pt was a child but still active. Mom stayed at home and helped around the house and for sporting events.) Patient's description of current relationship with people who raised him/her: Family is closer now, they are able to talk about situations that were  once chaotic or hard to discuss. How were you disciplined when you got in trouble as a child/adolescent?: Had things taken away, put on punishment  Does patient have siblings?: Yes Number of Siblings: 1 Description of patient's current relationship with siblings: Sister and pt has an extremely close reationship Did patient suffer any verbal/emotional/physical/sexual abuse as a child?: No Did patient suffer from severe childhood neglect?: No Has patient ever been sexually abused/assaulted/raped as an adolescent or adult?: No Was the patient ever a victim of a crime or a disaster?: No Witnessed domestic violence?: No Has patient been effected by domestic violence as an adult?: No  Education:  Highest grade of school patient has completed:  (Completed 12th grade and a semester of college) Currently a Consulting civil engineerstudent?: No Learning disability?: No  Employment/Work Situation:   Employment situation: Unemployed Patient's job has been impacted by current illness: No What is the longest time patient has a held a job?:  (One year and a half) Where was the patient employed at that time?: [employer] Has patient ever been in the [employer] and Companymilitary?: No Has patient ever served in combat?: No Did You Receive Any Psychiatric Treatment/Services While in Equities traderthe Military?: No Are There Guns or Other Weapons in Your Home?: No Are These ComptrollerWeapons Safely Secured?: Yes (N/A) Who Could Verify You Are Able To Have These Secured:: N/A  Financial Resources:   Financial resources: Support from parents / caregiver Does patient have a Lawyerrepresentative payee or guardian?:  (Mother and father are considered pt's guardian)  Alcohol/Substance Abuse:   What has been your use of drugs/alcohol within the last 12 months?:  (Cocaine (2-3 times), Marijuana (daily), acid (3 times), drink beer (once a week), xanax (7 or 8 times))  If attempted suicide, did drugs/alcohol play a role in this?: Yes Alcohol/Substance Abuse Treatment Hx:  (Has not  even had tx for substance abuse ) If yes, describe treatment:  (N/A) Has alcohol/substance abuse ever caused legal problems?: Yes (Was caught selling marijuana but was not charged )  Social Support System:   Patient's Community Support System: Production assistant, radio System:  (Has supportive neighbors and friends in the community) Type of faith/religion:  (N/A) How does patient's faith help to cope with current illness?:  (N/A)  Leisure/Recreation:   Leisure and Hobbies: Use to play soccer, enjoys listening to music  Strengths/Needs:   What things does the patient do well?: Rapping, soccer, good with hands  In what areas does patient struggle / problems for patient: Procrastination in school and responsibilities. Internal struggle such as mood swings  Discharge Plan:   Does patient have access to transportation?: Yes Will patient be returning to same living situation after discharge?: Yes Currently receiving community mental health services: Yes (From Whom) (Dr. Toni Arthurs and Loralie Champagne at [place] Does patient have financial barriers related to discharge medications?: No  Summary/Recommendations:   Summary and Recommendations (to be completed by the evaluator): Mark Kirk is a 20 y.o, caucasian male diagnosed with Major Depressive Disorder, Single Ep., Severe, with Anxious Features. Mark Kirk was admitted on a voluntary basis after intentionally overdosing on about 20 street-purchased Klonipin with intent to "really hurt myself or die." Patient has a therapist and psychiatrist that he sees on a regular basis and will continue treatment with that agency at discharge. Patient will benefit from crisis stabilization,medication evaluation, group therapy, and psycho education in addition to case management for discharge planning. Patient and CSW reviewed pt's identified goals and treatment plan. Pt verbalized understanding and agreed to treatment plan. At discharge it is  recommended that patient remain compliant with established plan and continue treatment.  [name] 05/22/2016

## 2016-05-22 NOTE — Progress Notes (Signed)
Patient ID: Mark Kirk, male   DOB: 04/23/1996, 20 y.o.   MRN: [id number]   Pt currently presents with a flat affect and depressed behavior. Per self inventory, pt rates depression at a 6, hopelessness 4 and anxiety 4. Pt's daily goal is to "not go insane from being confined to white walls" and they intend to do so by "attempt to relax." Pt reports poor sleep, a poor appetite, low energy and poor concentration. Pt cooperative, interacts minimally with staff and peers.   Pt provided with medications per providers orders. Pt's labs and vitals were monitored throughout the day. Pt supported emotionally and encouraged to express concerns and questions. Pt educated on medications.  Pt's safety ensured with 15 minute and environmental checks. Pt currently denies SI/HI and A/V hallucinations. Pt verbally agrees to seek staff if SI/HI or A/VH occurs and to consult with staff before acting on any harmful thoughts. Will continue POC.

## 2016-05-22 NOTE — H&P (Signed)
Psychiatric Admission Assessment Adult  Patient Identification: Mark Kirk MRN:  [id number] Date of Evaluation:  05/22/2016 Chief Complaint:  " I overdosed " Principal Diagnosis:  Major Depression, Recurrent, No Psychotic Features  Diagnosis:   Patient Active Problem List   Diagnosis Date Noted  . Major depressive disorder ([place]) [F32.9] 05/21/2016   History of Present Illness: 20 year old single male. Patient states he has a history of depression, anxiety, but states that over the last 1-2 years he feels he has been doing worse, which he feels may have been related to a sports related concussion. States " my grades dropped, and I have been more prone to impulsivity , anxiety". States he overdosed on Klonopin  ( # 20) and Cocaine, which he states was impulsive. States recently he had been " hanging out with different friends". States part of the reason he had not gone home was he did not want to tell his parents he had quit his job.  " I had not been home for a few days and my dad was calling me, I guess I just panicked ". States he does not think he was trying to die or commit suicide, but rather to seek relief from anxiety, depression. He states he had been prescribed Risperidone and Remeron for several weeks, but states he stopped them a week or so ago, as he felt they were not helping much, although states that Remeron does help sleep. Associated Signs/Symptoms: Depression Symptoms:  depressed mood, anhedonia, insomnia, feelings of worthlessness/guilt, recurrent thoughts of death, suicidal attempt, anxiety, loss of energy/fatigue, decreased appetite, has lost some weight  (Hypo) Manic Symptoms:   Denies  Anxiety Symptoms:   Reports tendency to worry, feels anxious. Psychotic Symptoms: Denies  PTSD Symptoms: Does not endorse  Total Time spent with patient: 45 minutes  Past Psychiatric History:  No prior psychiatric admissions, states he has never attempted suicide, but has had  thoughts of suicide in the past. No history of self cutting, states he has short term mood swings but does not endorse any clear history of  Mania or hypomania. No history of psychosis.  Is the patient at risk to self? Yes.    Has the patient been a risk to self in the past 6 months? Yes.    Has the patient been a risk to self within the distant past? No.  Is the patient a risk to others? No.  Has the patient been a risk to others in the past 6 months? No.  Has the patient been a risk to others within the distant past? No.   Prior Inpatient Therapy:  None  Prior Outpatient Therapy:  Sees Dr. Toy Cookey for outpatient psychiatric management , and sees a therapist, Kathrynn Speed  Alcohol Screening: 1. How often do you have a drink containing alcohol?: 2 to 4 times a month 2. How many drinks containing alcohol do you have on a typical day when you are drinking?: 3 or 4 3. How often do you have six or more drinks on one occasion?: Never Preliminary Score: 1 9. Have you or someone else been injured as a result of your drinking?: No 10. Has a relative or friend or a doctor or another health worker been concerned about your drinking or suggested you cut down?: No Alcohol Use Disorder Identification Test Final Score (AUDIT): 3 Brief Intervention: AUDIT score less than 7 or less-screening does not suggest unhealthy drinking-brief intervention not indicated Substance Abuse History in the last 12 months:  Drinks " a few beers" once a week, occasionally to intoxication, but states that he does not feel he is a heavy drinker. Last drank 5 days ago. Smokes cannabis daily. Used cocaine only once, denies any pattern of abuse. Describes history of BZD Abuse, mainly Xanax, but states he stopped year ago, recent Clonazepam use was isolated episode . Consequences of Substance Abuse: Denies  Previous Psychotropic Medications:  Currently on Remeron 15 mgrs QHS, Risperidone 1 mgr QDAY , for several weeks. Stopped  taking them one week ago. Psychological Evaluations: normal  Past Medical History: Concussion two years ago, as per patient, surgery for 5th metacarpal fracture from punching a " cooler" Past Medical History  Diagnosis Date  . Depression   . Bipolar 1 disorder ([place])   . Anxiety    History reviewed. No pertinent past surgical history. Family History: parents alive, live together, one younger sister  Family Psychiatric  History:  States he has a cousin who has Asperger's and thinks his father may have been Bipolar Disorder.  Two grandparents described as alcoholic. Tobacco Screening:  Smokes 1 PPD  Social History: single, recently quit his job in [employer], withdrew college last semester, denies legal issues, living with parents but states often stays with friends, no SO at this time. History  Alcohol Use  . Yes     History  Drug Use  . Yes  . Special: Cocaine, Marijuana    Additional Social History: Marital status: Single (Single) What is your sexual orientation?:  (Straight) Has your sexual activity been affected by drugs, alcohol, medication, or emotional stress?:  (N/A) Does patient have children?: No    Pain Medications: See PTA Prescriptions: See PTA Over the Counter: See PTA History of alcohol / drug use?: Yes Longest period of sobriety (when/how long): 6 months Negative Consequences of Use: Work / Youth worker Name of Substance 1: Marijuana 1 - Age of First Use: 15 1 - Amount (size/oz): 1/8 gram 1 - Frequency: Daily 1 - Duration: Ongoing Name of Substance 2: Cocaine 2 - Age of First Use: 19 2 - Amount (size/oz): "a bump" 2 - Frequency: Rarely 2 - Duration: 6 months 2 - Last Use / Amount: 05/20/16 Name of Substance 3: Alcohol 3 - Age of First Use: 17 3 - Amount (size/oz): "a couple of beers" 3 - Frequency: 2-4 times per month 3 - Duration: 1 year 3 - Last Use / Amount: 05/20/16              Allergies:   Allergies  Allergen Reactions  . Amoxicillin Hives    Lab Results:  Results for orders placed or performed during the hospital encounter of 05/21/16 (from the past 48 hour(s))  Rapid urine drug screen (hospital performed)     Status: Abnormal   Collection Time: 05/21/16  3:23 PM  Result Value Ref Range   Opiates NONE DETECTED NONE DETECTED   Cocaine POSITIVE (A) NONE DETECTED   Benzodiazepines NONE DETECTED NONE DETECTED   Amphetamines NONE DETECTED NONE DETECTED   Tetrahydrocannabinol POSITIVE (A) NONE DETECTED   Barbiturates NONE DETECTED NONE DETECTED    Comment:        DRUG SCREEN FOR MEDICAL PURPOSES ONLY.  IF CONFIRMATION IS NEEDED FOR ANY PURPOSE, NOTIFY LAB WITHIN 5 DAYS.        LOWEST DETECTABLE LIMITS FOR URINE DRUG SCREEN Drug Class       Cutoff (ng/mL) Amphetamine      1000 Barbiturate      200  Benzodiazepine   034 Tricyclics       742 Opiates          300 Cocaine          300 THC              50   Comprehensive metabolic panel     Status: Abnormal   Collection Time: 05/21/16  3:45 PM  Result Value Ref Range   Sodium 137 135 - 145 mmol/L   Potassium 3.8 3.5 - 5.1 mmol/L   Chloride 101 101 - 111 mmol/L   CO2 28 22 - 32 mmol/L   Glucose, Bld 99 65 - 99 mg/dL   BUN 13 6 - 20 mg/dL   Creatinine, Ser 0.99 0.61 - 1.24 mg/dL   Calcium 9.3 8.9 - 10.3 mg/dL   Total Protein 7.7 6.5 - 8.1 g/dL   Albumin 4.7 3.5 - 5.0 g/dL   AST 21 15 - 41 U/L   ALT 15 (L) 17 - 63 U/L   Alkaline Phosphatase 70 38 - 126 U/L   Total Bilirubin 1.0 0.3 - 1.2 mg/dL   GFR calc non Af Amer >60 >60 mL/min   GFR calc Af Amer >60 >60 mL/min    Comment: (NOTE) The eGFR has been calculated using the CKD EPI equation. This calculation has not been validated in all clinical situations. eGFR's persistently <60 mL/min signify possible Chronic Kidney Disease.    Anion gap 8 5 - 15  Ethanol     Status: None   Collection Time: 05/21/16  3:45 PM  Result Value Ref Range   Alcohol, Ethyl (B) <5 <5 mg/dL    Comment:        LOWEST DETECTABLE LIMIT  FOR SERUM ALCOHOL IS 5 mg/dL FOR MEDICAL PURPOSES ONLY   Salicylate level     Status: None   Collection Time: 05/21/16  3:45 PM  Result Value Ref Range   Salicylate Lvl <5.9 2.8 - 30.0 mg/dL  Acetaminophen level     Status: Abnormal   Collection Time: 05/21/16  3:45 PM  Result Value Ref Range   Acetaminophen (Tylenol), Serum <10 (L) 10 - 30 ug/mL    Comment:        THERAPEUTIC CONCENTRATIONS VARY SIGNIFICANTLY. A RANGE OF 10-30 ug/mL MAY BE AN EFFECTIVE CONCENTRATION FOR MANY PATIENTS. HOWEVER, SOME ARE BEST TREATED AT CONCENTRATIONS OUTSIDE THIS RANGE. ACETAMINOPHEN CONCENTRATIONS >150 ug/mL AT 4 HOURS AFTER INGESTION AND >50 ug/mL AT 12 HOURS AFTER INGESTION ARE OFTEN ASSOCIATED WITH TOXIC REACTIONS.   cbc     Status: None   Collection Time: 05/21/16  3:45 PM  Result Value Ref Range   WBC 9.5 4.0 - 10.5 K/uL   RBC 5.06 4.22 - 5.81 MIL/uL   Hemoglobin 14.5 13.0 - 17.0 g/dL   HCT 42.1 39.0 - 52.0 %   MCV 83.2 78.0 - 100.0 fL   MCH 28.7 26.0 - 34.0 pg   MCHC 34.4 30.0 - 36.0 g/dL   RDW 13.2 11.5 - 15.5 %   Platelets 225 150 - 400 K/uL    Blood Alcohol level:  Lab Results  Component Value Date   ETH <5 56/38/7564    Metabolic Disorder Labs:  No results found for: HGBA1C, MPG No results found for: PROLACTIN No results found for: CHOL, TRIG, HDL, CHOLHDL, VLDL, LDLCALC  Current Medications: Current Facility-Administered Medications  Medication Dose Route Frequency Provider Last Rate Last Dose  . acetaminophen (TYLENOL) tablet 650 mg  650 mg Oral  Q6H PRN Jenne Campus, MD      . alum & mag hydroxide-simeth (MAALOX/MYLANTA) 200-200-20 MG/5ML suspension 30 mL  30 mL Oral Q4H PRN Myer Peer Mika Griffitts, MD      . feeding supplement (ENSURE ENLIVE) (ENSURE ENLIVE) liquid 237 mL  237 mL Oral BID BM Myer Peer Caitrin Pendergraph, MD   237 mL at 05/22/16 1000  . hydrOXYzine (ATARAX/VISTARIL) tablet 25 mg  25 mg Oral Q6H PRN Myer Peer Katti Pelle, MD      . magnesium hydroxide (MILK OF  MAGNESIA) suspension 30 mL  30 mL Oral Daily PRN Jenne Campus, MD       PTA Medications: Prescriptions prior to admission  Medication Sig Dispense Refill Last Dose  . risperiDONE (RISPERDAL) 0.5 MG tablet Take 0.5 mg by mouth 2 (two) times daily.       Musculoskeletal: Strength & Muscle Tone: within normal limits Gait & Station: normal Patient leans: N/A  Psychiatric Specialty Exam: Physical Exam  Review of Systems  Constitutional: Negative.   HENT: Negative.   Eyes: Negative.   Respiratory: Negative.   Cardiovascular: Negative.   Gastrointestinal: Negative.   Genitourinary: Negative.   Musculoskeletal: Negative.   Skin: Negative.   Neurological: Negative for seizures.  Endo/Heme/Allergies: Negative.   Psychiatric/Behavioral: Positive for depression and substance abuse.  All other systems reviewed and are negative.   Blood pressure 117/89, pulse 60, temperature 98.5 F (36.9 C), temperature source Oral, resp. rate 16, height '5\' 9"'$  (1.753 m), weight 150 lb (68.04 kg), SpO2 100 %.Body mass index is 22.14 kg/(m^2).  General Appearance: Fairly Groomed  Eye Contact:  Fair  Speech:  Normal Rate  Volume:  Normal  Mood:  depressed, anxious  Affect:  constricted, but reactive , smiles at times appropriately   Thought Process:  Linear  Orientation:  Full (Time, Place, and Person)  Thought Content:  denies any hallucinations, no delusions   Suicidal Thoughts:  denies any suicidal ideations, denies any self injurious ideations, contracts for safety on the unit   Homicidal Thoughts:  No  Memory:  recent and remote grossly intact  Judgement:  Fair  Insight:  Fair  Psychomotor Activity:  Normal  Concentration:  Concentration: Good and Attention Span: Good  Recall:  Good  Fund of Knowledge:  Good  Language:  Good  Akathisia:  Negative  Handed:  Right  AIMS (if indicated):     Assets:  Communication Skills Desire for Improvement Resilience  ADL's:  Intact  Cognition:  WNL   Sleep:          Treatment Plan Summary: Daily contact with patient to assess and evaluate symptoms and progress in treatment, Medication management, Plan inpatient treatment  and medications as below   Observation Level/Precautions:  15 minute checks  Laboratory:  as needed   Psychotherapy:  Milieu, support   Medications:  We discussed options, patient does agree to antidepressant trial- start Celexa 10 mgrs QDAY initially   Consultations:  As needed   Discharge Concerns:  -   Estimated LOS: 5-6 days  Other:     I certify that inpatient services furnished can reasonably be expected to improve the patient's condition.    Neita Garnet, MD 6/8/201712:38 PM

## 2016-05-22 NOTE — Progress Notes (Signed)
Adult Psychoeducational Group Note  Date:  05/22/2016 Time:  1015  Group Topic/Focus:  Self Esteem Action Plan:   The focus of this group is to help patients create a plan to continue to build self-esteem after discharge.  Participation Level:  Minimal   Participation Quality:  Appropriate and Attentive  Affect:  Appropriate  Cognitive:  Alert and Appropriate  Insight: Improving  Engagement in Group:  Engaged  Modes of Intervention:  Discussion, Education and Rapport Building  Additional Comments:  Pt attended group didn't engage in group discussion although he completed hand that was given out in group  Gwenevere Ghazili, Remonia Otte Patience 05/22/2016, 11:41 AM

## 2016-05-22 NOTE — Progress Notes (Signed)
Patient did attend the evening karaoke group. Pt was engaged and supportive but did not participate by singing a song.    

## 2016-05-23 NOTE — Progress Notes (Signed)
[facility] MD Progress Note  05/23/2016 4:06 PM Mark Kirk  MRN:  [id number] Subjective:  Patient states he is feeling "OK", denies significant depression, denies any suicidal ideations , denies medication side effects.  Focused on being discharged soon. Objective : I have discussed case with treatment team and have met with patient . Today he presents alert, attentive, calm, well related, and describes his mood as "OK", denies depression . No SI. He is tolerating Celexa trial well, denies side effects, denies any activation or emergence of suicidal or self injurious ideations. We focused on substance abuse issues- patient acknowledges history of cannabis dependence, and also occasionally uses other substances, mainly " pills ".  He has some insight regarding the negative impact that substance abuse has had on his relationships with family and academic performance, but tends to minimize. We worked on improving insight and motivation regarding sobriety/abstinence, using motivational, non confrontational techniques. On unit, patient is calm, pleasant, and interacting appropriately with peers of about his age  As above, he is focused on being discharged soon  Principal Problem: Overdose Diagnosis:   Patient Active Problem List   Diagnosis Date Noted  . Overdose [T50.901A]   . Major depressive disorder ([place]) [F32.9] 05/21/2016   Total Time spent with patient: 20 minutes    Past Medical History:  Past Medical History  Diagnosis Date  . Depression   . Bipolar 1 disorder ([place])   . Anxiety    History reviewed. No pertinent past surgical history. Family History: History reviewed. No pertinent family history.  Social History:  History  Alcohol Use  . Yes     History  Drug Use  . Yes  . Special: Cocaine, Marijuana    Social History   Social History  . Marital Status: Single    Spouse Name: N/A  . Number of Children: N/A  . Years of Education: N/A   Social History Main Topics  .  Smoking status: Current Every Day Smoker    Types: Cigarettes  . Smokeless tobacco: None  . Alcohol Use: Yes  . Drug Use: Yes    Special: Cocaine, Marijuana  . Sexual Activity: Not Asked   Other Topics Concern  . None   Social History Narrative   Additional Social History:    Pain Medications: See PTA Prescriptions: See PTA Over the Counter: See PTA History of alcohol / drug use?: Yes Longest period of sobriety (when/how long): 6 months Negative Consequences of Use: Work / Youth worker Name of Substance 1: Marijuana 1 - Age of First Use: 15 1 - Amount (size/oz): 1/8 gram 1 - Frequency: Daily 1 - Duration: Ongoing Name of Substance 2: Cocaine 2 - Age of First Use: 19 2 - Amount (size/oz): "a bump" 2 - Frequency: Rarely 2 - Duration: 6 months 2 - Last Use / Amount: 05/20/16 Name of Substance 3: Alcohol 3 - Age of First Use: 17 3 - Amount (size/oz): "a couple of beers" 3 - Frequency: 2-4 times per month 3 - Duration: 1 year 3 - Last Use / Amount: 05/20/16  Sleep: Good  Appetite:  Good  Current Medications: Current Facility-Administered Medications  Medication Dose Route Frequency Provider Last Rate Last Dose  . acetaminophen (TYLENOL) tablet 650 mg  650 mg Oral Q6H PRN Jenne Campus, MD      . alum & mag hydroxide-simeth (MAALOX/MYLANTA) 200-200-20 MG/5ML suspension 30 mL  30 mL Oral Q4H PRN Jenne Campus, MD      . citalopram (CELEXA) tablet 10  mg  10 mg Oral Daily Jenne Campus, MD   10 mg at 05/23/16 0354  . feeding supplement (ENSURE ENLIVE) (ENSURE ENLIVE) liquid 237 mL  237 mL Oral BID BM Myer Peer Cobos, MD   237 mL at 05/23/16 1500  . hydrOXYzine (ATARAX/VISTARIL) tablet 25 mg  25 mg Oral Q6H PRN Jenne Campus, MD   25 mg at 05/22/16 2204  . magnesium hydroxide (MILK OF MAGNESIA) suspension 30 mL  30 mL Oral Daily PRN Jenne Campus, MD      . nicotine polacrilex (NICORETTE) gum 2 mg  2 mg Oral PRN Jenne Campus, MD   2 mg at 05/22/16 1859  .  traZODone (DESYREL) tablet 50 mg  50 mg Oral QHS PRN Jenne Campus, MD   50 mg at 05/22/16 2204    Lab Results: No results found for this or any previous visit (from the past 48 hour(s)).  Blood Alcohol level:  Lab Results  Component Value Date   ETH <5 05/21/2016    Physical Findings: AIMS: Facial and Oral Movements Muscles of Facial Expression: None, normal Lips and Perioral Area: None, normal Jaw: None, normal Tongue: None, normal,Extremity Movements Upper (arms, wrists, hands, fingers): None, normal Lower (legs, knees, ankles, toes): None, normal, Trunk Movements Neck, shoulders, hips: None, normal, Overall Severity Severity of abnormal movements (highest score from questions above): None, normal Incapacitation due to abnormal movements: None, normal Patient's awareness of abnormal movements (rate only patient's report): No Awareness, Dental Status Current problems with teeth and/or dentures?: No Does patient usually wear dentures?: No  CIWA:  CIWA-Ar Total: 2 COWS:  COWS Total Score: 2  Musculoskeletal: Strength & Muscle Tone: within normal limits Gait & Station: normal Patient leans: N/A  Psychiatric Specialty Exam: Physical Exam  ROS no headache, no chest pain, no shortness of breath  Blood pressure 107/64, pulse 64, temperature 98.1 F (36.7 C), temperature source Oral, resp. rate 16, height _0  (1.753 m), weight 150 lb (68.04 kg), SpO2 100 %.Body mass index is 22.14 kg/(m^2).  General Appearance: improved grooming   Eye Contact:  Good  Speech:  Normal Rate  Volume:  Normal  Mood:  minimizes depression, states mood "OK"  Affect:  mildly constricted but reactive   Thought Process:  Linear  Orientation:  Other:  alert, oriented x 3  Thought Content:  denies hallucinations, no delusions   Suicidal Thoughts:  No denies any suicidal ideations, denies any self injurious ideations,  no homicidal ideations   Homicidal Thoughts:  No  Memory:  recent and remote  grossly intact   Judgement:  Fair  Insight:  improving   Psychomotor Activity:  Normal  Concentration:  Concentration: Good and Attention Span: Good  Recall:  Good  Fund of Knowledge:  Good  Language:  Good  Akathisia:  Negative  Handed:  Right  AIMS (if indicated):     Assets:  Desire for Improvement Resilience  ADL's:  Intact  Cognition:  WNL  Sleep:  Number of Hours: 5.75   Assessment - patient denies depression, denies suicidal ideations, and is future oriented, hoping for discharge from unit soon. He presents mildly constricted, but reactive, in affect. He is gradually gaining insight into the negative impact that substance abuse (  mostly cannabis/ other substances to lesser degree  ) has likely had on his level of functioning . Thus far tolerating Celexa trial well.   Treatment Plan Summary: Daily contact with patient to assess and evaluate symptoms  and progress in treatment, Medication management, Plan inpatient admission  and medications as below  Encourage group , milieu participation to work on coping skills and symptom reduction Continue to encourage sobriety, abstinence efforts  Continue Celexa 10 mgrs QDAY for depression as needed  Treatment team working on disposition options   Neita Garnet, MD 05/23/2016, 4:06 PM

## 2016-05-23 NOTE — Progress Notes (Addendum)
Mark Kirk rates his depression, hopelessness and anxiety " 5/4/3", respectivel ( on his daily assessment).He writes that he denies SI today,. A HE takes his scheduled meds as planned and is attending his groups. He  Appears nervous and anxious. R Safety  maintained and support offered to pt.

## 2016-05-23 NOTE — Progress Notes (Signed)
Recreation Therapy Notes  Date: 06.09.2017 Time: 9:30am Location: [address] Dayroom   Group Topic: Stress Management  Goal Area(s) Addresses:  Patient will actively participate in stress management techniques presented during session.   Behavioral Response: Engaged, Attentive   Intervention: Stress management techniques  Activity :  Deep Breathing, Mindfulness and Mindful Breathing. LRT provided education, instruction and demonstration on practice of Deep Breathing, Mindfulness and Mindful Breathing. Patient was asked to participate in technique introduced during session.   Education:  Stress Management, Discharge Planning.   Education Outcome: Acknowledges education  Clinical Observations/Feedback: Patient actively engaged in technique introduced, expressed no concerns and demonstrated ability to practice independently post d/c.   Marykay Lexenise L Emerlyn Mehlhoff, LRT/CTRS        Cathie Bonnell L 05/23/2016 2:16 PM

## 2016-05-23 NOTE — [facility] Group Notes (Signed)
[facility] LCSW Group Therapy 05/23/2016 1:15pm  Type of Therapy: Group Therapy- Feelings Around Relapse and Recovery  Pt did not attend, declined invitation.   Chad CordialLauren Carter, Theresia MajorsLCSWA [phone] 05/23/2016 3:08 PM

## 2016-05-24 DIAGNOSIS — T1491 Suicide attempt: Secondary | ICD-10-CM

## 2016-05-24 DIAGNOSIS — F322 Major depressive disorder, single episode, severe without psychotic features: Principal | ICD-10-CM

## 2016-05-24 DIAGNOSIS — T424X2A Poisoning by benzodiazepines, intentional self-harm, initial encounter: Secondary | ICD-10-CM

## 2016-05-24 DIAGNOSIS — T405X2A Poisoning by cocaine, intentional self-harm, initial encounter: Secondary | ICD-10-CM

## 2016-05-24 NOTE — Progress Notes (Signed)
Anxiety and Depression 0/10. Goal today was to "see my parents and discuss my goals once I am discharged. My best friend also came to see me and I was happy about that".  Denies SI/HI/AVH. Encouragement and support given. Medications administered as prescribed. Continue Q 15 minute checks for patient safety and medication effectiveness.

## 2016-05-24 NOTE — Progress Notes (Signed)
[facility] MD Progress Note  05/24/2016 3:20 PM Mark Kirk  MRN:  [id number]   Subjective:  Patient has no complaints today and stated that he requested 72 hours release and hoping to be discharged tomorrow. Patient denies current symptoms of depression, anxiety, psychosis and denied active suicidal/homicidal ideation, intention or plans.   Objective : Patient seen face-to-face for this evaluation and discussed case with treatment team and have met with patient . Patient is able to participate in therapeutic milieu, concentrations and getting along with the staff and peers. Patient appeared calm, cooperative and pleasant during this evaluation  He is tolerating Celexa trial well, denies side effects, denies any activation or emergence of suicidal or self injurious ideations. We focused on substance abuse issues- patient acknowledges history of cannabis dependence, and also occasionally uses other substances, mainly " pills ".    Principal Problem: Overdose Diagnosis:   Patient Active Problem List   Diagnosis Date Noted  . Overdose [T50.901A]   . Major depressive disorder ([place]) [F32.9] 05/21/2016   Total Time spent with patient: 20 minutes    Past Medical History:  Past Medical History  Diagnosis Date  . Depression   . Bipolar 1 disorder ([place])   . Anxiety    History reviewed. No pertinent past surgical history. Family History: History reviewed. No pertinent family history.  Social History:  History  Alcohol Use  . Yes     History  Drug Use  . Yes  . Special: Cocaine, Marijuana    Social History   Social History  . Marital Status: Single    Spouse Name: N/A  . Number of Children: N/A  . Years of Education: N/A   Social History Main Topics  . Smoking status: Current Every Day Smoker    Types: Cigarettes  . Smokeless tobacco: None  . Alcohol Use: Yes  . Drug Use: Yes    Special: Cocaine, Marijuana  . Sexual Activity: Not Asked   Other Topics Concern  . None    Social History Narrative   Additional Social History:    Pain Medications: See PTA Prescriptions: See PTA Over the Counter: See PTA History of alcohol / drug use?: Yes Longest period of sobriety (when/how long): 6 months Negative Consequences of Use: Work / Youth worker Name of Substance 1: Marijuana 1 - Age of First Use: 15 1 - Amount (size/oz): 1/8 gram 1 - Frequency: Daily 1 - Duration: Ongoing Name of Substance 2: Cocaine 2 - Age of First Use: 19 2 - Amount (size/oz): "a bump" 2 - Frequency: Rarely 2 - Duration: 6 months 2 - Last Use / Amount: 05/20/16 Name of Substance 3: Alcohol 3 - Age of First Use: 17 3 - Amount (size/oz): "a couple of beers" 3 - Frequency: 2-4 times per month 3 - Duration: 1 year 3 - Last Use / Amount: 05/20/16  Sleep: Good  Appetite:  Good  Current Medications: Current Facility-Administered Medications  Medication Dose Route Frequency Provider Last Rate Last Dose  . acetaminophen (TYLENOL) tablet 650 mg  650 mg Oral Q6H PRN Jenne Campus, MD      . alum & mag hydroxide-simeth (MAALOX/MYLANTA) 200-200-20 MG/5ML suspension 30 mL  30 mL Oral Q4H PRN Myer Peer Cobos, MD      . citalopram (CELEXA) tablet 10 mg  10 mg Oral Daily Jenne Campus, MD   10 mg at 05/24/16 0741  . feeding supplement (ENSURE ENLIVE) (ENSURE ENLIVE) liquid 237 mL  237 mL Oral BID BM Felicita Gage  A Cobos, MD   237 mL at 05/24/16 0743  . hydrOXYzine (ATARAX/VISTARIL) tablet 25 mg  25 mg Oral Q6H PRN Jenne Campus, MD   25 mg at 05/23/16 2222  . magnesium hydroxide (MILK OF MAGNESIA) suspension 30 mL  30 mL Oral Daily PRN Jenne Campus, MD      . nicotine polacrilex (NICORETTE) gum 2 mg  2 mg Oral PRN Jenne Campus, MD   2 mg at 05/23/16 2032  . traZODone (DESYREL) tablet 50 mg  50 mg Oral QHS PRN Jenne Campus, MD   50 mg at 05/23/16 2221    Lab Results: No results found for this or any previous visit (from the past 34 hour(s)).  Blood Alcohol level:  Lab Results   Component Value Date   ETH <5 05/21/2016    Physical Findings: AIMS: Facial and Oral Movements Muscles of Facial Expression: None, normal Lips and Perioral Area: None, normal Jaw: None, normal Tongue: None, normal,Extremity Movements Upper (arms, wrists, hands, fingers): None, normal Lower (legs, knees, ankles, toes): None, normal, Trunk Movements Neck, shoulders, hips: None, normal, Overall Severity Severity of abnormal movements (highest score from questions above): None, normal Incapacitation due to abnormal movements: None, normal Patient's awareness of abnormal movements (rate only patient's report): No Awareness, Dental Status Current problems with teeth and/or dentures?: No Does patient usually wear dentures?: No  CIWA:  CIWA-Ar Total: 2 COWS:  COWS Total Score: 2  Musculoskeletal: Strength & Muscle Tone: within normal limits Gait & Station: normal Patient leans: N/A  Psychiatric Specialty Exam: Physical Exam  ROS no headache, no chest pain, no shortness of breath  Blood pressure 106/66, pulse 55, temperature 98.6 F (37 C), temperature source Oral, resp. rate 18, height _0  (1.753 m), weight 68.04 kg (150 lb), SpO2 100 %.Body mass index is 22.14 kg/(m^2).  General Appearance: improved grooming   Eye Contact:  Good  Speech:  Normal Rate  Volume:  Normal  Mood:  minimizes depression, states mood "OK"  Affect:  mildly constricted but reactive   Thought Process:  Linear  Orientation:  Other:  alert, oriented x 3  Thought Content:  denies hallucinations, no delusions   Suicidal Thoughts:  No denies any suicidal ideations, denies any self injurious ideations,  no homicidal ideations   Homicidal Thoughts:  No  Memory:  recent and remote grossly intact   Judgement:  Fair  Insight:  improving   Psychomotor Activity:  Normal  Concentration:  Concentration: Good and Attention Span: Good  Recall:  Good  Fund of Knowledge:  Good  Language:  Good  Akathisia:  Negative   Handed:  Right  AIMS (if indicated):     Assets:  Desire for Improvement Resilience  ADL's:  Intact  Cognition:  WNL  Sleep:  Number of Hours: 6.25   Assessment - Patien  denies depression, denies suicidal ideations, and is future oriented, hoping for discharge from unit soon. He presents mildly constricted, but reactive, in affect. He is gradually gaining insight into the negative impact that substance abuse (  mostly cannabis/ other substances to lesser degree And then has likely had on his level of functioning .   Treatment Plan Summary: Reviewed current treatment plan and agree with  Daily contact with patient to assess and evaluate symptoms and progress in treatment, Medication management, Plan inpatient admission  and medications as below    Encourage group , milieu participation to work on coping skills and symptom reduction Continue to  encourage sobriety, abstinence efforts  Continue Celexa 10 mgrs QDAY for depression as needed  Continue trazodone 50 mg at bedtime as needed Treatment team working on disposition options    Ambrose Finland, MD 05/24/2016, 3:20 PM

## 2016-05-24 NOTE — [facility] Group Notes (Signed)
Patient attend group. day was a 9. He meet his goal.

## 2016-05-24 NOTE — [facility] Group Notes (Signed)
[facility] Group Notes:  (Nursing/MHT/Case Management/Adjunct)  Date:  05/24/2016  Time:  1300  Type of Therapy:  Nurse Education:  Identifying Needs :   The group focuses on teaching patients how to identify their needs as well as develop skills needed to get their needs met.  Participation Level:  Active  Participation Quality:  Appropriate  Affect:  Appropriate  Cognitive:  Alert  Insight:  Appropriate  Engagement in Group:  Engaged  Modes of Intervention:  Education  Summary of Progress/Problems:  Lauralyn Primes 05/24/2016, 3:32 PM

## 2016-05-24 NOTE — [facility] Group Notes (Signed)
[facility] Group Notes:  (Nursing/MHT/Case Management/Adjunct)  Date:  05/24/2016  Time:  0900  Type of Therapy:  Goals Group:  The group focuses on teaching patients how to identify daily goals and daily goal- setting can be an effective healthy coping skill.  Participation Level: Good    Participation Quality:  Appropriate  Affect:  Appropriate  Cognitive:  Alert  Insight:  Appropriate  Engagement in Group:  Engaged  Modes of Intervention:  Education  Summary of Progress/Problems:  Rich BraveDuke, Robin Petrakis Lynn 05/24/2016, 3:09 PM

## 2016-05-24 NOTE — Progress Notes (Signed)
Mark Kirk is out in the milieu this AM.he interacts appropriately with staff and his peers.He is pleasant, cooperative. HE completed his daily assessment and on it he wrote he denied SI  And he rated his depression, hopelessness and anxiety " 1/1/1" , respectively. R Safety in place.

## 2016-05-24 NOTE — [facility] Group Notes (Signed)
[facility] LCSW Group Therapy  05/24/2016    9:30 - 10:30 AM  Type of Therapy:  Group Therapy  Participation Level:  Active  Participation Quality:  Appropriate  Affect:  Appropriate  Cognitive:  Appropriate  Insight:  Developing/Improving  Engagement in Therapy:  Developing/Improving  Modes of Intervention:  Discussion, Exploration, Rapport Building, Socialization and Support  Summary of Progress/Problems:   Summary of Progress/Problems: The main focus of today's process group was for the patient to identify ways in which they have in the past sabotaged their own recovery. Motivational Interviewing was utilized to ask the group members what they get out of their self sabotaging behavior(s), and what reasons they may have for wanting to change. The Stages of Change were explained using a handout, and patients identified where they currently are with regard to stages of change. Mark Kirk engaged easily and shared his patterns of procrastination with school assignments. Patient encouraged others and offered insights at multiple points. Mark Kirk is invested in creating more balance upon discharge and he believes this will decrease his substance use to an appropriate amount of time.    Carney Bernatherine C Harrill, LCSW

## 2016-05-25 DIAGNOSIS — F322 Major depressive disorder, single episode, severe without psychotic features: Secondary | ICD-10-CM | POA: Insufficient documentation

## 2016-05-25 NOTE — Progress Notes (Signed)
Patient ID: Nile Riggsndrew Verdone, male   DOB: 07/05/1996, 20 y.o.   MRN: [id number]   Pt currently presents with a masked affect and pleasant behavior. Pt reports depression and anxiety at a 0. Pt's daily goal is to "talk to my doctor and develop my discharge plan today or tomorrow" and they intend to do so by "talk to my doctor /SW and be honest about my progression." Pt reports good sleep, a good appetite, high energy and good concentration. Pt writes "thank you for helping me see the light at the end of the tunnel." Pt overheard talking to pts about the best way to word requests for medications to the MD today.    Pt provided with medications per providers orders. Pt's labs and vitals were monitored throughout the day. Pt supported emotionally and encouraged to express concerns and questions. Pt educated on medications and suicide prevention resources.   Pt's safety ensured with 15 minute and environmental checks. Pt currently denies SI/HI and A/V hallucinations. Pt verbally agrees to seek staff if SI/HI or A/VH occurs and to consult with staff before acting on any harmful thoughts. Pt requests to be discharged today to return home with his parents. Will continue POC.

## 2016-05-25 NOTE — Progress Notes (Signed)
[facility]  05/25/2016 2:10 PM Mark Kirk  MRN:  [id number]   Subjective:  Patient stated that he has been wandering higher dose of medication citalopram may help him or want to see what actually does. Patient stated he does not have any increased symptoms of depression, anxiety and reportedly compliant with his medication without any adverse affects. Patient denies current symptoms of depression, anxiety, psychosis and denied active suicidal/homicidal ideation, intention or plans. Patient has no irritability, agitation or aggressive behaviors. Patient is getting along with staff members, peers. Patient contract for safety while in the hospital.  Objective : Patient is able to participate in therapeutic milieu, concentrations and getting along with the staff and peers. Patient appeared calm, cooperative and pleasant during this evaluation  He is tolerating Celexa trial well, denies side effects, denies any activation or emergence of suicidal or self injurious ideations. We focused on substance abuse issues- patient acknowledges history of cannabis dependence, and also occasionally uses other substances.     Principal Problem: Overdose Diagnosis:   Patient Active Problem List   Diagnosis Date Noted  . Overdose [T50.901A]   . Major depressive disorder ([facility]) [F32.9] 05/21/2016   Total Time spent with patient: 20 minutes    Past Medical History:  Past Medical History  Diagnosis Date  . Depression   . Bipolar 1 disorder (H[facility])   . Anxiety    History reviewed. No pertinent past surgical history. Family History: History reviewed. No pertinent family history.  Social History:  History  Alcohol Use  . Yes     History  Drug Use  . Yes  . Special: Cocaine, Marijuana    Social History   Social History  . Marital Status: Single    Spouse Name: N/A  . Number of Children: N/A  . Years of Education: N/A   Social History Main Topics  . Smoking status: Current Every Day  Smoker    Types: Cigarettes  . Smokeless tobacco: None  . Alcohol Use: Yes  . Drug Use: Yes    Special: Cocaine, Marijuana  . Sexual Activity: Not Asked   Other Topics Concern  . None   Social History Narrative   Additional Social History:    Pain Medications: See PTA Prescriptions: See PTA Over the Counter: See PTA History of alcohol / drug use?: Yes Longest period of sobriety (when/how long): 6 months Negative Consequences of Use: Work / Programmer, [employer] Name of Substance 1: Marijuana 1 - Age of First Use: 15 1 - Amount (size/oz): 1/8 gram 1 - Frequency: Daily 1 - Duration: Ongoing Name of Substance 2: Cocaine 2 - Age of First Use: 19 2 - Amount (size/oz): "a bump" 2 - Frequency: Rarely 2 - Duration: 6 months 2 - Last Use / Amount: 05/20/16 Name of Substance 3: Alcohol 3 - Age of First Use: 17 3 - Amount (size/oz): "a couple of beers" 3 - Frequency: 2-4 times per month 3 - Duration: 1 year 3 - Last Use / Amount: 05/20/16  Sleep: Good  Appetite:  Good  Current Medications: Current Facility-Administered Medications  Medication Dose Route Frequency Provider Last Rate Last Dose  . acetaminophen (TYLENOL) tablet 650 mg  650 mg Oral Q6H PRN Craige CottaFernando A Cobos, MD      . alum & mag hydroxide-simeth (MAALOX/MYLANTA) 200-200-20 MG/5ML suspension 30 mL  30 mL Oral Q4H PRN Rockey SituFernando A Cobos, MD      . citalopram (CELEXA) tablet 10 mg  10 mg Oral Daily Madaline GuthrieFernando A  Cobos, MD   10 mg at 05/25/16 0824  . feeding supplement (ENSURE ENLIVE) (ENSURE ENLIVE) liquid 237 mL  237 mL Oral BID BM Rockey Situ Cobos, MD   237 mL at 05/25/16 1106  . hydrOXYzine (ATARAX/VISTARIL) tablet 25 mg  25 mg Oral Q6H PRN Craige Cotta, MD   25 mg at 05/24/16 2253  . magnesium hydroxide (MILK OF MAGNESIA) suspension 30 mL  30 mL Oral Daily PRN Craige Cotta, MD      . nicotine polacrilex (NICORETTE) gum 2 mg  2 mg Oral PRN Craige Cotta, MD   2 mg at 05/25/16 1610  . traZODone (DESYREL) tablet 50 mg  50 mg  Oral QHS PRN Craige Cotta, MD   50 mg at 05/24/16 2253    Lab Results: No results found for this or any previous visit (from the past 48 hour(s)).  Blood Alcohol level:  Lab Results  Component Value Date   ETH <5 05/21/2016    Physical Findings: AIMS: Facial and Oral Movements Muscles of Facial Expression: None, normal Lips and Perioral Area: None, normal Jaw: None, normal Tongue: None, normal,Extremity Movements Upper (arms, wrists, hands, fingers): None, normal Lower (legs, knees, ankles, toes): None, normal, Trunk Movements Neck, shoulders, hips: None, normal, Overall Severity Severity of abnormal movements (highest score from questions above): None, normal Incapacitation due to abnormal movements: None, normal Patient's awareness of abnormal movements (rate only patient's report): No Awareness, Dental Status Current problems with teeth and/or dentures?: No Does patient usually wear dentures?: No  CIWA:  CIWA-Ar Total: 2 COWS:  COWS Total Score: 2  Musculoskeletal: Strength & Muscle Tone: within normal limits Gait & Station: normal Patient leans: N/A  Psychiatric Specialty Exam: Physical Exam  ROS no headache, no chest pain, no shortness of breath  Blood pressure 103/82, pulse 82, temperature 98.3 F (36.8 C), temperature source Oral, resp. rate 16, height  (1.753 m), weight 68.04 kg (150 lb), SpO2 100 %.Body mass index is 22.14 kg/(m^2).  General Appearance: improved grooming   Eye Contact:  Good  Speech:  Normal Rate  Volume:  Normal  Mood:  minimizes depression, states mood "OK"  Affect:  mildly constricted but reactive   Thought Process:  Linear  Orientation:  Other:  alert, oriented x 3  Thought Content:  denies hallucinations, no delusions   Suicidal Thoughts:  No denies any suicidal ideations, denies any self injurious ideations,  no homicidal ideations   Homicidal Thoughts:  No  Memory:  recent and remote grossly intact   Judgement:  Fair   Insight:  improving   Psychomotor Activity:  Normal  Concentration:  Concentration: Good and Attention Span: Good  Recall:  Good  Fund of Knowledge:  Good  Language:  Good  Akathisia:  Negative  Handed:  Right  AIMS (if indicated):     Assets:  Desire for Improvement Resilience  ADL's:  Intact  Cognition:  WNL  Sleep:  Number of Hours: 6.25   Assessment - Patien Has been doing much better since he has been admitted to the hospital with the medication management. Patient has been tolerating well his medication and hoping to be discharged soon. Patient is actively participating in therapeutic milieu and possibly responding and learning coping skills. Patient affect is mildly constricted, but reactive. Patient is gradually gaining insight into the negative impact that substance abuse ( mostly cannabis/ other substances to lesser degree and then has likely had on his level of functioning .  Treatment Plan Summary: Daily contact with patient to assess and evaluate symptoms and progress in treatment, Medication management, Plan inpatient admission  and medications as below    Encourage group , milieu participation to work on coping skills and symptom reduction Continue to encourage sobriety, abstinence efforts  Continue Celexa 10 mgrs QDAY for depression as needed  Continue trazodone 50 mg at bedtime as needed Treatment team working on disposition options    Leata Mouse, MD 05/25/2016, 2:10 PM

## 2016-05-25 NOTE — Progress Notes (Signed)
Patient was pleasant, calm, and cooperative on milieu this evening. Patient that he has been using his deep breathing techniques to help with management of stress. He denies SI, HI, and AVH. He rated his depression/anxiety/hopeless a 0/0/0.  He attended group and has interacted with his peers.   Patient remains safe on floor with q 15 min checks. Education, encouragement, and support offered. Medications administered. Patient contracts for safety verbally.   Patient is receptive and compliant, will continue to monitor.

## 2016-05-25 NOTE — [facility] Group Notes (Signed)
[facility] Group Notes:  (Nursing/MHT/Case Management/Adjunct)  Date:  05/25/2016  Time: 0900  Type of Therapy:  Nurse Education  Participation Level:  Active  Participation Quality:  Appropriate  Affect:  Appropriate  Cognitive:  Appropriate  Insight:  Appropriate  Engagement in Group:  Engaged  Modes of Intervention:  Discussion, Education and Support  Summary of Progress/Problems: Mark Kirk said he hoped to work on developing his discharge plan. He hoped to leave today or tomorrow. He said yesterday was his best day yet, and he has a lead on a potential job.  Maurine SimmeringShugart, Marene Gilliam M 05/25/2016, 10:11 AM

## 2016-05-25 NOTE — [facility] Group Notes (Signed)
[facility] LCSW Group Therapy  05/25/2016 10:00 AM  Type of Therapy:  Group Therapy  Participation Level:  Active  Participation Quality:  Appropriate and Attentive  Affect:  Appropriate  Cognitive:  Alert, Appropriate and Oriented  Insight:  Developing/Improving  Engagement in Therapy:  Improving  Modes of Intervention:  Discussion  Summary of Progress/Problems: Group was about developing a strong support system. Group participants processed five different types of supports that may be helpful for their journey in recovery. Discussed unconditional supports, accountable supports, goal oriented supports, professional supports and yourself as a support. Participants discussed freely the challenges and benefits of all of these types of supports. Participants were able to identify who those supports have been for them and how they can use trust in order to develop these supports in other areas. Patient was able to understand and reflect the concepts clearly.  Beverly SessionsLINDSEY, Besse Miron J 05/25/2016, 3:29 PM

## 2016-05-26 MED ORDER — NICOTINE POLACRILEX 2 MG MT GUM: 2.0000 mg | CHEWING_GUM | OROMUCOSAL | Status: DC | PRN

## 2016-05-26 MED ORDER — CITALOPRAM HYDROBROMIDE 10 MG PO TABS: 10.0000 mg | ORAL_TABLET | Freq: Every day | ORAL | Status: DC

## 2016-05-26 MED ORDER — HYDROXYZINE HCL 25 MG PO TABS: 25.0000 mg | ORAL_TABLET | Freq: Four times a day (QID) | ORAL | Status: DC | PRN

## 2016-05-26 MED ORDER — TRAZODONE HCL 50 MG PO TABS: 50.0000 mg | ORAL_TABLET | Freq: Every evening | ORAL | Status: DC | PRN

## 2016-05-26 NOTE — Progress Notes (Signed)
  [facility]  Will you be returning to the same living situation after discharge:  Yes,  Pt returning home At discharge, do you have transportation home?: Yes,  pt parents to pick up Do you have the ability to pay for your medications: Yes,  Pt provided with prescriptions  Release of information consent forms completed and in the chart;  Patient's signature needed at discharge.  Patient to Follow up at: Follow-up Information    Follow up with [facility].   Why:  CSW has left 2 messages at this practice in attempts to schedule a therapy appointment. Please follow-up with your therapist, Marchelle Folksmanda, to schedule your next appointment.   Contact information:   [address] [place], [place] [address] Phone: [phone] Fax: [phone]      Follow up with Dr. Len Blalockavid Fuller.   Why:  CSW has left 2 messages at this practice in attempts to schedule a therapy appointment. Please follow-up with your therapist, Marchelle Folksmanda, to schedule your next appointment   Contact information:   [address] [place], [place] [address] Phone: [phone]      Next level of care provider [facility] Link:no  Safety Planning and Suicide Prevention discussed: Yes,  with mother; see SPE note  Have you used any form of tobacco in the last 30 days? (Cigarettes, Smokeless Tobacco, Cigars, and/or Pipes): Yes  Has patient been referred to the Quitline?: Patient refused referral  Patient has been referred for addiction treatment: Yes  Elaina HoopsCarter, Wadie Mattie M 05/26/2016, 11:38 AM

## 2016-05-26 NOTE — Progress Notes (Signed)
Mark Kirk was  D/C from the unit to lobby accompanied by family.  He was pleasant and cooperative. He voiced no SI/HI or A/V halluciations.  Pt. Denies any pain or discomfort.  D/C instructions and medications reviewed with pt.  Pt. verbalized understanding of medications and d/c instructions.  He did not have belongings in the locker.  Q 15 min checks maintained until discharge.  Pt. Left the unit in no apparent distress.

## 2016-05-26 NOTE — Progress Notes (Signed)
Pt has been in the dayroom talking with his peers and watching TV.  He reports he is doing well and feels he is ready to go home. He denies SI/HI/AVH.  He has been pleasant and appropriate this evening.  He has voiced no needs or concerns.  He makes his needs known to staff.  Support and encouragement offered.  Discharge plans are in process. Safety maintained with q15 minute checks.

## 2016-05-26 NOTE — Discharge Summary (Signed)
Physician Discharge Summary Note  Patient:  Mark Kirk is an 20 y.o., male MRN:  [id number] DOB:  [birth year] Patient phone:  [phone] (home)  Patient address:   [address] [place] [address],  Total Time spent with patient: 30 minutes  Date of Admission:  05/21/2016 Date of Discharge: 05/26/2016  Reason for Admission:  Klonopin overdose and cocaine abuse  Principal Problem: Overdose Discharge Diagnoses:  MDD Patient Active Problem List   Diagnosis Date Noted  . Severe single current episode of major depressive disorder, without psychotic features (HCC) [F32.2]   . Overdose [T50.901A]   . Major depressive disorder (HCC) [F32.9] 05/21/2016    Past Psychiatric History: see HPI  Past Medical History:  Past Medical History  Diagnosis Date  . Depression   . Bipolar 1 disorder (HCC)   . Anxiety    History reviewed. No pertinent past surgical history. Family History: History reviewed. No pertinent family history. Family Psychiatric  History: see HPI Social History:  History  Alcohol Use  . Yes     History  Drug Use  . Yes  . Special: Cocaine, Marijuana    Social History   Social History  . Marital Status: Single    Spouse Name: N/A  . Number of Children: N/A  . Years of Education: N/A   Social History Main Topics  . Smoking status: Current Every Day Smoker    Types: Cigarettes  . Smokeless tobacco: None  . Alcohol Use: Yes  . Drug Use: Yes    Special: Cocaine, Marijuana  . Sexual Activity: Not Asked   Other Topics Concern  . None   Social History Narrative    Hospital Course:   Mark Kirk has history of depression and anxiety.  He reported upon ED presentation that he suffered a  Sports related concussion and had progressively declined over the last 1-2 years.   He reported overdosing on Klonopin ( # 20) and Cocaine, which he states was impulsive.  Mark Kirk was admitted for Overdose and crisis management.  He was treated with Celexa  10 mgrs QDAY for depression and Trazodone 50 mg at bedtime as needed.  Medical problems were identified and treated as needed.  Home medications were restarted as appropriate.  Improvement was monitored by observation and Mark Kirk daily report of symptom reduction.  Emotional and mental status was monitored by daily self inventory reports completed by Mark Kirk and clinical staff.  Patient reported continued improvement, denied any new concerns.  Patient had been compliant on medications and denied side effects.  Support and encouragement was provided.    At time of discharge, patient rated both depression and anxiety levels to be manageable and minimal.  Patient encouraged to attend groups to help with recognizing triggers of emotional crises and de-stabilizations.  Patient encouraged to attend group to help identify the positive things in life that would help in dealing with feelings of loss, depression and unhealthy or abusive tendencies.         Mark Kirk was evaluated by the treatment team for stability and plans for continued recovery upon discharge.  He was offered further treatment options upon discharge including Residential, Intensive Outpatient and Outpatient treatment. He will follow up with agencies listed below for medication management and counseling.  Encouraged patient to maintain satisfactory support network and home environment.  Advised to adhere to medication compliance and outpatient treatment follow up.      Mark Kirk motivation was an integral factor for scheduling further  treatment.  Employment, transportation, bed availability, health status, family support, and any pending legal issues were also considered during his hospital stay.  Upon completion of this admission the patient was both mentally and medically stable for discharge denying suicidal/homicidal ideation, auditory/visual/tactile hallucinations, delusional thoughts and paranoia.      Physical  Findings: AIMS: Facial and Oral Movements Muscles of Facial Expression: None, normal Lips and Perioral Area: None, normal Jaw: None, normal Tongue: None, normal,Extremity Movements Upper (arms, wrists, hands, fingers): None, normal Lower (legs, knees, ankles, toes): None, normal, Trunk Movements Neck, shoulders, hips: None, normal, Overall Severity Severity of abnormal movements (highest score from questions above): None, normal Incapacitation due to abnormal movements: None, normal Patient's awareness of abnormal movements (rate only patient's report): No Awareness, Dental Status Current problems with teeth and/or dentures?: No Does patient usually wear dentures?: No  CIWA:  CIWA-Ar Total: 2 COWS:  COWS Total Score: 2  Musculoskeletal: Strength & Muscle Tone: within normal limits Gait & Station: normal Patient leans: N/A  Psychiatric Specialty Exam:  See MD SRA Physical Exam  Vitals reviewed. Psychiatric: His mood appears anxious. He exhibits a depressed mood.    Review of Systems  Psychiatric/Behavioral: Negative for depression, suicidal ideas and hallucinations.    Blood pressure 133/90, pulse 66, temperature 97.6 F (36.4 C), temperature source Oral, resp. rate 18, height 5\' 9"  (1.753 m), weight 68.04 kg (150 lb), SpO2 100 %.Body mass index is 22.14 kg/(m^2).   Have you used any form of tobacco in the last 30 days? (Cigarettes, Smokeless Tobacco, Cigars, and/or Pipes): Yes  Has this patient used any form of tobacco in the last 30 days? (Cigarettes, Smokeless Tobacco, Cigars, and/or Pipes) Yes, Rx given  Blood Alcohol level:  Lab Results  Component Value Date   ETH <5 05/21/2016    Metabolic Disorder Labs:  No results found for: HGBA1C, MPG No results found for: PROLACTIN No results found for: CHOL, TRIG, HDL, CHOLHDL, VLDL, LDLCALC  See Psychiatric Specialty Exam and Suicide Risk Assessment completed by Attending Physician prior to discharge.  Discharge  destination:  Home  Is patient on multiple antipsychotic therapies at discharge:  No   Has Patient had three or more failed trials of antipsychotic monotherapy by history:  No  Recommended Plan for Multiple Antipsychotic Therapies: NA     Medication List    STOP taking these medications        mirtazapine 15 MG tablet  Commonly known as:  REMERON     risperiDONE 0.5 MG tablet  Commonly known as:  RISPERDAL      TAKE these medications      Indication   citalopram 10 MG tablet  Commonly known as:  CELEXA  Take 1 tablet (10 mg total) by mouth daily.   Indication:  Depression     hydrOXYzine 25 MG tablet  Commonly known as:  ATARAX/VISTARIL  Take 1 tablet (25 mg total) by mouth every 6 (six) hours as needed for anxiety (sleep).   Indication:  Anxiety Neurosis     nicotine polacrilex 2 MG gum  Commonly known as:  NICORETTE  Take 1 each (2 mg total) by mouth as needed for smoking cessation.   Indication:  Nicotine Addiction     traZODone 50 MG tablet  Commonly known as:  DESYREL  Take 1 tablet (50 mg total) by mouth at bedtime as needed for sleep.   Indication:  Trouble Sleeping       Follow-up Information    Follow  up with [facility].   Why:  CSW left message at this practice on 6/9 requesting therapy appointment.   Contact information:   [address] [place], [place] [address] Phone: [phone] Fax: [phone]      Follow up with Dr. Len Blalock.   Why:  CSW left a message with Dr. Toni Arthurs on 6/9 requesting a discharge appointment.    Contact information:   [address] [place], [place] [address] Phone: [phone]      Follow-up recommendations:  Activity:  as tol Diet:  as tol  Comments:  1.  Take all your medications as prescribed.   2.  Report any adverse side effects to outpatient provider. 3.  Patient instructed to not use alcohol or illegal drugs while on prescription medicines. 4.  In the event of worsening  symptoms, instructed patient to call 911, the crisis hotline or go to nearest emergency room for evaluation of symptoms.  Signed: Lindwood Qua, NP [facility] 05/26/2016, 10:47 AM  Patient seen, Suicide Assessment Completed.  Disposition Plan Reviewed

## 2016-05-26 NOTE — [facility]HH Suicide Risk Assessment (Signed)
[facility] Discharge Suicide Risk Assessment   Principal Problem: Overdose Discharge Diagnoses:  Patient Active Problem List   Diagnosis Date Noted  . Severe single current episode of major depressive disorder, without psychotic features (HCC) [F32.2]   . Overdose [T50.901A]   . Major depressive disorder (HCC) [F32.9] 05/21/2016    Total Time spent with patient: 30 minutes  Musculoskeletal: Strength & Muscle Tone: within normal limits Gait & Station: normal Patient leans: N/A  Psychiatric Specialty Exam: ROS  Blood pressure 133/90, pulse 66, temperature 97.6 F (36.4 C), temperature source Oral, resp. rate 18, height  (1.753 m), weight 150 lb (68.04 kg), SpO2 100 %.Body mass index is 22.14 kg/(m^2).  General Appearance: Well Groomed  Eye Contact::  Good  Speech:  Normal Rate409  Volume:  Normal  Mood:  improved, currently denies depression, states mood "OK"  Affect:  Appropriate and Full Range  Thought Process:  Linear  Orientation:  Other:  fully oriented x 3  Thought Content:  no hallucinations, no delusions   Suicidal Thoughts:  No- denies suicidal ideations, denies self injurious ideations, denies any homicidal or violent ideations  Homicidal Thoughts:  No  Memory:  recent and remote grossly intact   Judgement:  Improving   Insight:  improving   Psychomotor Activity:  Normal  Concentration:  Good  Recall:  Good  Fund of Knowledge:Good  Language: Good  Akathisia:  Negative  Handed:  Right  AIMS (if indicated):     Assets:  Desire for Improvement Resilience  Sleep:  Number of Hours: 6.25  Cognition: WNL  ADL's:  Intact   Mental Status Per Nursing Assessment::   On Admission:  Self-harm behaviors  Demographic Factors:  20 year old single male  Loss Factors: Recent job loss   Historical Factors: history of substance abuse,no history of prior psychiatric admissions, no history of suicidal attempts   Risk Reduction Factors:   Sense of responsibility to  family, Living with another person, especially a relative and Positive coping skills or problem solving skills  Continued Clinical Symptoms:  At this time patient is alert, attentive,well related, pleasant, well groomed, calm, mood is "OK", denies any depression , presents euthymic, with a full range of affect, no thought disorder, no suicidal or homicidal ideations, no psychotic symptoms, future oriented. Denies medication side effects- side effects reviewed, to include potential risk of increased suicidal ideations or self injurious ideations early in treatment with antidepressants in young adults . He is in good spirits for discharge  We reviewed benefits of sobriety, abstinence, insight and motivation regarding maintaining abstinence from drugs/illicit substances seems improved.   Cognitive Features That Contribute To Risk:  No gross cognitive deficits noted upon discharge. Is alert , attentive, and oriented x 3     Suicide Risk:  Mild:  Suicidal ideation of limited frequency, intensity, duration, and specificity.  There are no identifiable plans, no associated intent, mild dysphoria and related symptoms, good self-control (both objective and subjective assessment), few other risk factors, and identifiable protective factors, including available and accessible social support.  Follow-up Information    Follow up with [facility].   Why:  CSW has left 2 messages at this practice in attempts to schedule a therapy appointment. Please follow-up with your therapist, Marchelle Folks, to schedule your next appointment.   Contact information:   [address] [place], [place] [address] Phone: [phone] Fax: [phone]      Follow up with Dr. Len Blalock.   Why:  CSW  has left 2 messages at this practice in attempts to schedule a therapy appointment. Please follow-up with your therapist, Marchelle Folksmanda, to schedule your next appointment   Contact information:   [address] [place], [place] [address] Phone: [phone]) [phone]      Plan Of Care/Follow-up recommendations:  Activity:  as tolerated Diet:  Regular Tests:  NA Other:  See below  Patient is requesting discharge and there are no current grounds for involuntary commitment He is leaving unit in good spirits He plans to return home Plans to follow up as above Abstinence from cannabis and other illicit substances encouraged .  Mark MassedOBOS, FERNANDO, MD 05/26/2016, 2:47 PM

## 2016-05-26 NOTE — BHH Group Notes (Signed)
BHH Group Notes:  (Nursing/MHT/Case Management/Adjunct)  Date:  05/26/2016  Time:  1:47 PM  Type of Therapy:  Psychoeducational Skills  Participation Level:  Active  Participation Quality:  Appropriate  Affect:  Appropriate  Cognitive:  Appropriate  Insight:  Good  Engagement in Group:  Engaged  Modes of Intervention:  Discussion and Education  Summary of Progress/Problems:Today's group was about wellness. Pt stated wellness means "keeping myself occupied to stay away from bad habits and living a balanced life." Pt stated he enjoys "listening to and writing music, soccer, skating." Pt stated these activities provide "enough gratification to replace something else that's gratifying but bad for you."  Pt reported as not having thoughts of SI/HI at this time.    Darlis LoanFaiza  Asif-Fraz 05/26/2016, 1:47 PM

## 2016-05-26 NOTE — Progress Notes (Signed)
Patient ID: Mark Kirk, male   DOB: 08/30/1996, 20 y.o.   MRN: [id number]   Pt currently presents with a pleasant affect and cooperative behavior. Per self inventory, pt rates depression at a 0, hopelessness 0 and anxiety 2. Pt's daily goal is to "discharge" and they intend to do so by "my doctor." Pt reports good sleep, a good appetite, normal energy and good concentration. Pt smiles and interacts positively with peers today. Reports his plan is to go back home to live with his parents post discharge.  Pt provided with medications per providers orders. Pt's labs and vitals were monitored throughout the day. Pt supported emotionally and encouraged to express concerns and questions. Pt educated on medications and suicide prevention resources.   Pt's safety ensured with 15 minute and environmental checks. Pt currently denies SI/HI and A/V hallucinations. Pt verbally agrees to seek staff if SI/HI or A/VH occurs and to consult with staff before acting on any harmful thoughts. Pt to be discharged today. Will continue POC.

## 2016-05-26 NOTE — Progress Notes (Signed)
Recreation Therapy Notes  Date: 06.12.2047 Time: 9:30am Location: [address] Group Room   Group Topic: Stress Management  Goal Area(s) Addresses:  Patient will actively participate in stress management techniques presented during session.   Behavioral Response: Did not attend.   Ercilia Bettinger L Tamico Mundo, LRT/CTRS        Sayer Masini L 05/26/2016 10:12 AM 

## 2016-05-26 NOTE — Tx Team (Signed)
Interdisciplinary Treatment Plan Update (Adult)  Date:  05/26/2016   Time Reviewed:  11:36 AM   Progress in Treatment: Attending groups: Yes. Participating in groups:  Yes. Taking medication as prescribed:  Yes. Tolerating medication:  Yes. Family/Significant other contact made:  Yes with mother Patient understands diagnosis:  Yes  As evidenced by seeking help with anxiety and substance abuse Discussing patient identified problems/goals with staff:  Yes, see initial care plan. Medical problems stabilized or resolved:  Yes. Denies suicidal/homicidal ideation: Yes. Issues/concerns per patient self-inventory:  No. Other:  New problem(s) identified:  Discharge Plan or Barriers: see below  Reason for Continuation of Hospitalization: Anxiety Suicidal ideation  Comments: Patient complains of ongoing depression and on Monday took Klonopin 20 tablets and powder cocaine in attempt to hurt himself. Has had similar thoughts in past.  We discussed options, agrees to antidepressant trial, start CELEXA 10 mgrs QDAY initially, side effects discussed   Estimated length of stay: 0 days  New goal(s):  Review of initial/current patient goals per problem list:   Review of initial/current patient goals per problem list:  1. Goal(s): Patient will participate in aftercare plan   Met: Yes   Target date: 3-5 days post admission date   As evidenced by: Patient will participate within aftercare plan AEB aftercare provider and housing plan at discharge being identified.  05/26/2016: Patient will return home with mother and follow up with current providers.  2. Goal (s): Patient will exhibit decreased depressive symptoms and suicidal ideations.   Met: Yes   Target date: 3-5 days post admission date   As evidenced by: Patient will utilize self rating of depression at 3 or below and demonstrate decreased signs of depression or be deemed stable for discharge by MD.  05/22/2016: Patient still  presents with depression symptoms. Depression rating score at a 6.  05/26/2016: Pt rates depression at 0/10; denies SI   3. Goal(s): Patient will demonstrate decreased signs and symptoms of anxiety.   Met: Yes   Target date: 3-5 days post admission date   As evidenced by: Patient will utilize self rating of anxiety at 3 or below and demonstrated decreased signs of anxiety, or be deemed stable for discharge by MD  05/22/2016: Anxiety symptoms still present at this time. Anxiety score at a 7.  05/26/2016: Pt rates anxiety at 2/10   4. Goal(s): Patient will demonstrate decreased signs of withdrawal due to substance abuse   Met: Yes   Target date: 3-5 days post admission date   As evidenced by: Patient will produce a CIWA/COWS score of 0, have stable vitals signs, and no symptoms of withdrawal 05/22/16:  No signs nor symptoms of withdrawal today    Attendees: Patient:  05/26/2016 11:36 AM   Family:   05/26/2016 11:36 AM   Physician:  Dr. Jama Flavors, MD 05/26/2016 11:36 AM   Nursing:   Earl Many, RN 05/26/2016 11:36 AM   CSW:    Chad Cordial, LCSW   05/26/2016 11:36 AM   Other:  05/26/2016 11:36 AM   Other:   05/26/2016 11:36 AM   Other:   05/26/2016 11:36 AM   Other:   05/26/2016 11:36 AM   Other:   05/26/2016 11:36 AM   Other:  05/26/2016 11:36 AM   Other:  05/26/2016 11:36 AM   Other:  05/26/2016 11:36 AM   Other:  05/26/2016 11:36 AM   Other:  05/26/2016 11:36 AM   Other:   05/26/2016 11:36 AM    Scribe for  Treatment Team:   Peri Maris, [place] Work [phone]

## 2016-05-26 NOTE — [facility] Suicide Risk Assessment (Addendum)
[facility]   Principal Problem: Overdose Discharge Diagnoses:  Patient Active Problem List   Diagnosis Date Noted  . Severe single current episode of major depressive disorder, without psychotic features (HCC) [F32.2]   . Overdose [T50.901A]   . Major depressive disorder (HCC) [F32.9] 05/21/2016    Total Time spent with patient: 30 minutes  Musculoskeletal: Strength & Muscle Tone: within normal limits Gait & Station: normal Patient leans: N/A  Psychiatric Specialty Exam: ROS no headache, no chest pain, no shortness of breath, no vomiting, no rash   Blood pressure 133/90, pulse 66, temperature 97.6 F (36.4 C), temperature source Oral, resp. rate 18, height 5\' 9"  (1.753 m), weight 150 lb (68.04 kg), SpO2 100 %.Body mass index is 22.14 kg/(m^2).  General Appearance: Well Groomed  Eye Contact::  Good  Speech:  Normal Rate409  Volume:  Normal  Mood:  euthymic   Affect:  Appropriate and reactive, vaguely anxious   Thought Process:  Linear  Orientation:  Full (Time, Place, and Person)  Thought Content:  denies hallucinations, no delusions   Suicidal Thoughts:  No- denies any suicidal or self injurious ideations   Homicidal Thoughts:  No- denies any violent or homicidal ideations   Memory:  recent and remote grossly intact   Judgement:  Other:  improving   Insight:  improving   Psychomotor Activity:  Normal  Concentration:  Good  Recall:  Good  Fund of Knowledge:Good  Language: Good  Akathisia:  Negative  Handed:  Right  AIMS (if indicated):     Assets:  Communication Skills Desire for Improvement Resilience  Sleep:  Number of Hours: 6.25  Cognition: WNL  ADL's:  Intact   Mental Status Per Nursing Assessment::   On Admission:  Self-harm behaviors  Demographic Factors:  20 year old single male, lives with parents.   Loss Factors: Recently quit job, currently unemployed, financial difficulties   Historical Factors: No prior psychiatric  admissions, history of suicide ideations in the past, history of cannabis dependence   Risk Reduction Factors:   Sense of responsibility to family, Living with another person, especially a relative and Positive coping skills or problem solving skills  Continued Clinical Symptoms:  At this time he is improved compared to admission , presents well groomed, calm, mood is improved and currently euthymic,affect is appropriate and full in range, no thought disorder, no SI or HI, no psychotic symptoms. Denies medication side effects- we have reviewed side effects, to include risk of increased suicidal ideations early in treatment with antidepressants in young adults .   Cognitive Features That Contribute To Risk:  No gross cognitive deficits noted upon discharge. Is alert , attentive, and oriented x 3   Suicide Risk:  Mild:  Suicidal ideation of limited frequency, intensity, duration, and specificity.  There are no identifiable plans, no associated intent, mild dysphoria and related symptoms, good self-control (both objective and subjective assessment), few other risk factors, and identifiable protective factors, including available and accessible social support.  Follow-up Information    Follow up with [facility].   Why:  CSW has left 2 messages at this practice in attempts to schedule a therapy appointment. Please follow-up with your therapist, Marchelle Folksmanda, to schedule your next appointment.   Contact information:   [address] [place], [place] [address] Phone: [phone] Fax: [phone]      Follow up with Dr. Len Blalockavid Fuller.   Why:  CSW has left 2 messages at this practice in attempts  to schedule a therapy appointment. Please follow-up with your therapist, Marchelle Folks, to schedule your next appointment   Contact information:   [address] [place], [place] [address] Phone: [phone]      Plan Of Care/Follow-up recommendations:  Activity:  as tolerated  Diet:   Regular  Tests:  NA Other:  See below  Patient is leaving unit in good spirits  Plans to return home.  Patient states parents are picking him up later  Follow up as above  Nehemiah Massed, MD 05/26/2016, 12:03 PM

## 2016-06-26 DIAGNOSIS — F3112 Bipolar disorder, current episode manic without psychotic features, moderate: Secondary | ICD-10-CM | POA: Diagnosis not present

## 2016-07-31 ENCOUNTER — Telehealth: Payer: Self-pay | Admitting: Internal Medicine

## 2016-07-31 NOTE — Telephone Encounter (Signed)
Received records from [facility] for appointment with Dr Hilty on 08/21/16.  Records given to N Hines (medical records) for Dr Hilty's schedule on 08/21/16. lp °

## 2016-08-21 ENCOUNTER — Ambulatory Visit: Payer: BLUE CROSS/BLUE SHIELD | Admitting: Internal Medicine

## 2016-09-03 ENCOUNTER — Telehealth: Payer: Self-pay | Admitting: Internal Medicine

## 2016-09-03 NOTE — Telephone Encounter (Signed)
[facility] for appointment on 09/19/16 with Dr Rennis GoldenHilty.  [facility] lp

## 2016-09-19 ENCOUNTER — Ambulatory Visit: Payer: BLUE CROSS/BLUE SHIELD | Admitting: Internal Medicine

## 2016-09-22 DIAGNOSIS — F3112 Bipolar disorder, current episode manic without psychotic features, moderate: Secondary | ICD-10-CM | POA: Diagnosis not present

## 2016-10-08 DIAGNOSIS — F3289 Other specified depressive episodes: Secondary | ICD-10-CM | POA: Diagnosis not present

## 2016-10-29 DIAGNOSIS — F3289 Other specified depressive episodes: Secondary | ICD-10-CM | POA: Diagnosis not present

## 2016-11-05 ENCOUNTER — Ambulatory Visit (INDEPENDENT_AMBULATORY_CARE_PROVIDER_SITE_OTHER): Payer: Self-pay | Admitting: Internal Medicine

## 2016-11-05 ENCOUNTER — Encounter: Payer: Self-pay | Admitting: Internal Medicine

## 2016-11-05 VITALS — BP 114/80 | HR 51 | Ht 69.0 in | Wt 142.0 lb

## 2016-11-05 DIAGNOSIS — R001 Bradycardia, unspecified: Secondary | ICD-10-CM | POA: Insufficient documentation

## 2016-11-05 DIAGNOSIS — I491 Atrial premature depolarization: Secondary | ICD-10-CM | POA: Insufficient documentation

## 2016-11-05 NOTE — Progress Notes (Signed)
OFFICE NOTE  Chief Complaint:  "not sure why I'm here"  Primary Care Physician: Mark HasMORROW, AARON, MD  HPI:  Mark Kirk is a 20 y.o. male who is referred to me by his primary care provider although thought that he was sent here by a psychiatrist. His past medical history significant for polysubstance use and a suicide attempt earlier this year. He said he was in a very dark place at that time and is now fortunate that he is doing better. He says he's found his calling and wants to go into criminal justice. According to his referral documents from his primary care provider he was noted to have a history of palpitations, sinus bradycardia and had had PACs on EKG. I did review that EKG in the past. He denies any clinical palpitations that are concerning. He says he's had a low heart rate "all of his life" which she has attributed to being in "very good shape". He says he has no shortness of breath or chest pain with exertion. He says he's given up most of his illicit drugs except for their 1. He continues to smoke cigarettes on a "social basis". He takes citalopram, hydroxyzine and trazodone per his psychiatrist. There is no significant family history of coronary disease.  PMHx:  Past Medical History:  Diagnosis Date  . Allergic rhinitis   . Anxiety   . Bipolar 1 disorder (HCC)   . Bradycardia   . Depression   . PAC (premature atrial contraction)   . Palpitations   . Palpitations     History reviewed. No pertinent surgical history.  FAMHx:  History reviewed. No pertinent family history. No early onset CAD noted.  SOCHx:   reports that he has been smoking Cigarettes.  He has never used smokeless tobacco. He reports that he drinks alcohol. He reports that he uses drugs, including Cocaine and Marijuana.  ALLERGIES:  Allergies  Allergen Reactions  . Amoxicillin Hives    .Marland Kitchen.Has patient had a PCN reaction causing immediate rash, facial/tongue/throat swelling, SOB or lightheadedness  with hypotension: UNKNOWN Has patient had a PCN reaction causing severe rash involving mucus membranes or skin necrosis: UNKNOWN Has patient had a PCN reaction that required hospitalization UNKNOWN Has patient had a PCN reaction occurring within the last 10 years:UNKNOWN If all of the above answers are "NO", then may proceed with Cephalosporin use.     ROS: Pertinent items noted in HPI and remainder of comprehensive ROS otherwise negative.  HOME MEDS: Current Outpatient Prescriptions on File Prior to Visit  Medication Sig Dispense Refill  . citalopram (CELEXA) 10 MG tablet Take 1 tablet (10 mg total) by mouth daily. 30 tablet 0  . hydrOXYzine (ATARAX/VISTARIL) 25 MG tablet Take 1 tablet (25 mg total) by mouth every 6 (six) hours as needed for anxiety (sleep). 30 tablet 0  . nicotine polacrilex (NICORETTE) 2 MG gum Take 1 each (2 mg total) by mouth as needed for smoking cessation. 100 tablet 0  . traZODone (DESYREL) 50 MG tablet Take 1 tablet (50 mg total) by mouth at bedtime as needed for sleep. 30 tablet 0   No current facility-administered medications on file prior to visit.     LABS/IMAGING: No results found for this or any previous visit (from the past 48 hour(s)). No results found.  WEIGHTS: Wt Readings from Last 3 Encounters:  11/05/16 142 lb (64.4 kg) (28 %, Z= -0.57)*  05/21/16 150 lb (68 kg) (44 %, Z= -0.15)*   * Growth percentiles  are based on CDC 2-20 Years data.    VITALS: BP 114/80   Pulse (!) 51   Ht 5\' 9"  (1.753 m)   Wt 142 lb (64.4 kg)   BMI 20.97 kg/m   EXAM: General appearance: alert and no distress Neck: no carotid bruit and no JVD Lungs: clear to auscultation bilaterally Heart: regular rate and rhythm, S1, S2 normal, no murmur, click, rub or gallop Abdomen: soft, non-tender; bowel sounds normal; no masses,  no organomegaly Extremities: extremities normal, atraumatic, no cyanosis or edema Pulses: 2+ and symmetric Skin: Skin color, texture, turgor  normal. No rashes or lesions Neurologic: Grossly normal Psych: Pleasant  EKG: Sinus bradycardia at 51  ASSESSMENT: 1. Asymptomatic sinus bradycardia 2. Asymptomatic PACs  PLAN: 1.   Mark Kirk denies any problems related to sinus bradycardia or PACs. It's unlikely has any significant structural heart disease and his exam is benign without murmur. There is no family history of early-onset heart disease. He's able to exercise without any limitations. Since he is asymptomatic I would not recommend a further workup at this time.  Thanks for the kind referral.  Mark NoseKenneth C. Elizabet Schweppe, MD, [facility] CampusFACC Attending Cardiologist [facility]  Mark NoseKenneth C Katerra Kirk 11/05/2016, 5:24 PM

## 2016-11-05 NOTE — Patient Instructions (Signed)
Medication Instructions:  Your physician recommends that you continue on your current medications as directed. Please refer to the Current Medication list given to you today.   Labwork: NONE  Testing/Procedures: NONE  Follow-Up: Your physician recommends that you schedule a follow-up appointment in: AS NEEDED      Any Other Special Instructions Will Be Listed Below (If Applicable).     If you need a refill on your cardiac medications before your next appointment, please call your pharmacy.   

## 2016-11-26 DIAGNOSIS — F3289 Other specified depressive episodes: Secondary | ICD-10-CM | POA: Diagnosis not present

## 2016-12-24 DIAGNOSIS — F3289 Other specified depressive episodes: Secondary | ICD-10-CM | POA: Diagnosis not present

## 2017-01-07 DIAGNOSIS — F3112 Bipolar disorder, current episode manic without psychotic features, moderate: Secondary | ICD-10-CM | POA: Diagnosis not present

## 2017-01-21 DIAGNOSIS — J029 Acute pharyngitis, unspecified: Secondary | ICD-10-CM | POA: Diagnosis not present

## 2017-01-21 DIAGNOSIS — J111 Influenza due to unidentified influenza virus with other respiratory manifestations: Secondary | ICD-10-CM | POA: Diagnosis not present

## 2017-06-01 DIAGNOSIS — F319 Bipolar disorder, unspecified: Secondary | ICD-10-CM | POA: Diagnosis not present

## 2017-06-01 DIAGNOSIS — M898X1 Other specified disorders of bone, shoulder: Secondary | ICD-10-CM | POA: Diagnosis not present

## 2017-06-01 DIAGNOSIS — R202 Paresthesia of skin: Secondary | ICD-10-CM | POA: Diagnosis not present

## 2017-06-01 DIAGNOSIS — L309 Dermatitis, unspecified: Secondary | ICD-10-CM | POA: Diagnosis not present

## 2017-06-03 DIAGNOSIS — F3289 Other specified depressive episodes: Secondary | ICD-10-CM | POA: Diagnosis not present

## 2017-06-15 DIAGNOSIS — M542 Cervicalgia: Secondary | ICD-10-CM | POA: Diagnosis not present

## 2017-06-20 DIAGNOSIS — M542 Cervicalgia: Secondary | ICD-10-CM | POA: Diagnosis not present

## 2017-06-29 DIAGNOSIS — M542 Cervicalgia: Secondary | ICD-10-CM | POA: Diagnosis not present

## 2017-07-17 DIAGNOSIS — F3289 Other specified depressive episodes: Secondary | ICD-10-CM | POA: Diagnosis not present

## 2017-07-22 DIAGNOSIS — F3289 Other specified depressive episodes: Secondary | ICD-10-CM | POA: Diagnosis not present

## 2017-07-27 ENCOUNTER — Encounter (HOSPITAL_COMMUNITY): Payer: Self-pay | Admitting: Emergency Medicine

## 2017-07-27 ENCOUNTER — Emergency Department (HOSPITAL_COMMUNITY): Payer: No Typology Code available for payment source

## 2017-07-27 ENCOUNTER — Emergency Department (HOSPITAL_COMMUNITY)
Admission: EM | Admit: 2017-07-27 | Discharge: 2017-07-27 | Disposition: A | Discharge status: Home / Self Care | Payer: No Typology Code available for payment source | Attending: Emergency Medicine | Admitting: Emergency Medicine

## 2017-07-27 DIAGNOSIS — S199XXA Unspecified injury of neck, initial encounter: Secondary | ICD-10-CM | POA: Diagnosis not present

## 2017-07-27 DIAGNOSIS — S060X0A Concussion without loss of consciousness, initial encounter: Secondary | ICD-10-CM | POA: Insufficient documentation

## 2017-07-27 DIAGNOSIS — S0180XA Unspecified open wound of other part of head, initial encounter: Secondary | ICD-10-CM | POA: Diagnosis not present

## 2017-07-27 DIAGNOSIS — Y998 Other external cause status: Secondary | ICD-10-CM | POA: Insufficient documentation

## 2017-07-27 DIAGNOSIS — Y92410 Unspecified street and highway as the place of occurrence of the external cause: Secondary | ICD-10-CM | POA: Diagnosis not present

## 2017-07-27 DIAGNOSIS — Z79899 Other long term (current) drug therapy: Secondary | ICD-10-CM | POA: Insufficient documentation

## 2017-07-27 DIAGNOSIS — Y939 Activity, unspecified: Secondary | ICD-10-CM | POA: Diagnosis not present

## 2017-07-27 DIAGNOSIS — S0990XA Unspecified injury of head, initial encounter: Secondary | ICD-10-CM | POA: Insufficient documentation

## 2017-07-27 DIAGNOSIS — R4182 Altered mental status, unspecified: Secondary | ICD-10-CM | POA: Diagnosis present

## 2017-07-27 LAB — COMPREHENSIVE METABOLIC PANEL
ALBUMIN: 4.1 g/dL (ref 3.5–5.0)
ALK PHOS: 49 U/L (ref 38–126)
ALT: 24 U/L (ref 17–63)
ANION GAP: 9 (ref 5–15)
AST: 32 U/L (ref 15–41)
BUN: 11 mg/dL (ref 6–20)
CHLORIDE: 105 mmol/L (ref 101–111)
CO2: 25 mmol/L (ref 22–32)
CREATININE: 1.05 mg/dL (ref 0.61–1.24)
Calcium: 9.2 mg/dL (ref 8.9–10.3)
GFR calc Af Amer: >60 mL/min (ref >60)
GFR calc non Af Amer: >60 mL/min (ref >60)
GLUCOSE: 94 mg/dL (ref 65–99)
POTASSIUM: 3.9 mmol/L (ref 3.5–5.1)
SODIUM: 139 mmol/L (ref 135–145)
Total Bilirubin: 0.7 mg/dL (ref 0.3–1.2)
Total Protein: 6.4 g/dL — ABNORMAL LOW (ref 6.5–8.1)

## 2017-07-27 LAB — CBC WITH DIFFERENTIAL/PLATELET
BASOS ABS: 0 10*3/uL (ref 0.0–0.1)
BASOS PCT: 0 %
EOS ABS: 0.1 10*3/uL (ref 0.0–0.7)
Eosinophils Relative: 1 %
HEMATOCRIT: 39.5 % (ref 39.0–52.0)
Hemoglobin: 13.2 g/dL (ref 13.0–17.0)
Lymphocytes Relative: 27 %
Lymphs Abs: 2.3 10*3/uL (ref 0.7–4.0)
MCH: 28.4 pg (ref 26.0–34.0)
MCHC: 33.4 g/dL (ref 30.0–36.0)
MCV: 85.1 fL (ref 78.0–100.0)
MONO ABS: 0.6 10*3/uL (ref 0.1–1.0)
Monocytes Relative: 7 %
NEUTROS ABS: 5.6 10*3/uL (ref 1.7–7.7)
Neutrophils Relative %: 65 %
PLATELETS: 191 10*3/uL (ref 150–400)
RBC: 4.64 MIL/uL (ref 4.22–5.81)
RDW: 12.9 % (ref 11.5–15.5)
WBC: 8.7 10*3/uL (ref 4.0–10.5)

## 2017-07-27 LAB — ETHANOL

## 2017-07-27 LAB — I-STAT CG4 LACTIC ACID, ED: LACTIC ACID, VENOUS: 0.59 mmol/L (ref 0.5–1.9)

## 2017-07-27 NOTE — Discharge Instructions (Signed)
Take over the counter medications as needed for pain, expect to be stiff and sore for the next week
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
## 2017-07-27 NOTE — ED Provider Notes (Addendum)
CT scans are negative.  Findings discussed with patient.  He still is very sleepy.   Will make sure someone picks him up unless he wakes up more.    Mark Kirk, Mark Edmundson, MD 07/27/17 [phone]  Pt is speaking to his girlfriend who is at the bedside.  Pt is stable for dc   Mark Kirk, Kellyjo Edgren, MD 07/27/17 [phone]

## 2017-08-03 DIAGNOSIS — S060X9A Concussion with loss of consciousness of unspecified duration, initial encounter: Secondary | ICD-10-CM | POA: Diagnosis not present

## 2017-08-03 DIAGNOSIS — F319 Bipolar disorder, unspecified: Secondary | ICD-10-CM | POA: Diagnosis not present

## 2017-08-14 DIAGNOSIS — F341 Dysthymic disorder: Secondary | ICD-10-CM | POA: Diagnosis not present

## 2017-08-14 DIAGNOSIS — F0631 Mood disorder due to known physiological condition with depressive features: Secondary | ICD-10-CM | POA: Diagnosis not present

## 2017-08-14 DIAGNOSIS — Z79899 Other long term (current) drug therapy: Secondary | ICD-10-CM | POA: Diagnosis not present

## 2017-09-07 DIAGNOSIS — F411 Generalized anxiety disorder: Secondary | ICD-10-CM | POA: Diagnosis not present

## 2017-09-07 DIAGNOSIS — F330 Major depressive disorder, recurrent, mild: Secondary | ICD-10-CM | POA: Diagnosis not present

## 2017-09-22 DIAGNOSIS — F330 Major depressive disorder, recurrent, mild: Secondary | ICD-10-CM | POA: Diagnosis not present

## 2017-09-22 DIAGNOSIS — F411 Generalized anxiety disorder: Secondary | ICD-10-CM | POA: Diagnosis not present

## 2017-10-19 DIAGNOSIS — F330 Major depressive disorder, recurrent, mild: Secondary | ICD-10-CM | POA: Diagnosis not present

## 2017-10-19 DIAGNOSIS — F411 Generalized anxiety disorder: Secondary | ICD-10-CM | POA: Diagnosis not present

## 2017-11-16 DIAGNOSIS — F331 Major depressive disorder, recurrent, moderate: Secondary | ICD-10-CM | POA: Diagnosis not present

## 2017-11-16 DIAGNOSIS — F411 Generalized anxiety disorder: Secondary | ICD-10-CM | POA: Diagnosis not present

## 2017-12-21 DIAGNOSIS — F330 Major depressive disorder, recurrent, mild: Secondary | ICD-10-CM | POA: Diagnosis not present

## 2017-12-21 DIAGNOSIS — F411 Generalized anxiety disorder: Secondary | ICD-10-CM | POA: Diagnosis not present

## 2017-12-29 DIAGNOSIS — M9901 Segmental and somatic dysfunction of cervical region: Secondary | ICD-10-CM | POA: Diagnosis not present

## 2017-12-29 DIAGNOSIS — M9903 Segmental and somatic dysfunction of lumbar region: Secondary | ICD-10-CM | POA: Diagnosis not present

## 2017-12-29 DIAGNOSIS — M5386 Other specified dorsopathies, lumbar region: Secondary | ICD-10-CM | POA: Diagnosis not present

## 2017-12-29 DIAGNOSIS — M9902 Segmental and somatic dysfunction of thoracic region: Secondary | ICD-10-CM | POA: Diagnosis not present

## 2017-12-30 DIAGNOSIS — M9902 Segmental and somatic dysfunction of thoracic region: Secondary | ICD-10-CM | POA: Diagnosis not present

## 2017-12-30 DIAGNOSIS — M5386 Other specified dorsopathies, lumbar region: Secondary | ICD-10-CM | POA: Diagnosis not present

## 2017-12-30 DIAGNOSIS — M9901 Segmental and somatic dysfunction of cervical region: Secondary | ICD-10-CM | POA: Diagnosis not present

## 2017-12-30 DIAGNOSIS — M9903 Segmental and somatic dysfunction of lumbar region: Secondary | ICD-10-CM | POA: Diagnosis not present

## 2018-01-04 DIAGNOSIS — M9903 Segmental and somatic dysfunction of lumbar region: Secondary | ICD-10-CM | POA: Diagnosis not present

## 2018-01-04 DIAGNOSIS — M9902 Segmental and somatic dysfunction of thoracic region: Secondary | ICD-10-CM | POA: Diagnosis not present

## 2018-01-04 DIAGNOSIS — M5386 Other specified dorsopathies, lumbar region: Secondary | ICD-10-CM | POA: Diagnosis not present

## 2018-01-04 DIAGNOSIS — M9901 Segmental and somatic dysfunction of cervical region: Secondary | ICD-10-CM | POA: Diagnosis not present

## 2018-01-06 DIAGNOSIS — M9903 Segmental and somatic dysfunction of lumbar region: Secondary | ICD-10-CM | POA: Diagnosis not present

## 2018-01-06 DIAGNOSIS — M5386 Other specified dorsopathies, lumbar region: Secondary | ICD-10-CM | POA: Diagnosis not present

## 2018-01-06 DIAGNOSIS — M9902 Segmental and somatic dysfunction of thoracic region: Secondary | ICD-10-CM | POA: Diagnosis not present

## 2018-01-06 DIAGNOSIS — M9901 Segmental and somatic dysfunction of cervical region: Secondary | ICD-10-CM | POA: Diagnosis not present

## 2018-01-07 DIAGNOSIS — M9903 Segmental and somatic dysfunction of lumbar region: Secondary | ICD-10-CM | POA: Diagnosis not present

## 2018-01-07 DIAGNOSIS — M5386 Other specified dorsopathies, lumbar region: Secondary | ICD-10-CM | POA: Diagnosis not present

## 2018-01-07 DIAGNOSIS — M9901 Segmental and somatic dysfunction of cervical region: Secondary | ICD-10-CM | POA: Diagnosis not present

## 2018-01-07 DIAGNOSIS — M9902 Segmental and somatic dysfunction of thoracic region: Secondary | ICD-10-CM | POA: Diagnosis not present

## 2018-01-11 DIAGNOSIS — M9903 Segmental and somatic dysfunction of lumbar region: Secondary | ICD-10-CM | POA: Diagnosis not present

## 2018-01-11 DIAGNOSIS — M9901 Segmental and somatic dysfunction of cervical region: Secondary | ICD-10-CM | POA: Diagnosis not present

## 2018-01-11 DIAGNOSIS — M5386 Other specified dorsopathies, lumbar region: Secondary | ICD-10-CM | POA: Diagnosis not present

## 2018-01-11 DIAGNOSIS — M9902 Segmental and somatic dysfunction of thoracic region: Secondary | ICD-10-CM | POA: Diagnosis not present

## 2018-01-13 DIAGNOSIS — M5386 Other specified dorsopathies, lumbar region: Secondary | ICD-10-CM | POA: Diagnosis not present

## 2018-01-13 DIAGNOSIS — M9901 Segmental and somatic dysfunction of cervical region: Secondary | ICD-10-CM | POA: Diagnosis not present

## 2018-01-13 DIAGNOSIS — M9903 Segmental and somatic dysfunction of lumbar region: Secondary | ICD-10-CM | POA: Diagnosis not present

## 2018-01-13 DIAGNOSIS — M9902 Segmental and somatic dysfunction of thoracic region: Secondary | ICD-10-CM | POA: Diagnosis not present

## 2018-01-25 DIAGNOSIS — M5386 Other specified dorsopathies, lumbar region: Secondary | ICD-10-CM | POA: Diagnosis not present

## 2018-01-25 DIAGNOSIS — M9902 Segmental and somatic dysfunction of thoracic region: Secondary | ICD-10-CM | POA: Diagnosis not present

## 2018-01-25 DIAGNOSIS — M9901 Segmental and somatic dysfunction of cervical region: Secondary | ICD-10-CM | POA: Diagnosis not present

## 2018-01-25 DIAGNOSIS — M9903 Segmental and somatic dysfunction of lumbar region: Secondary | ICD-10-CM | POA: Diagnosis not present

## 2018-01-27 DIAGNOSIS — M9902 Segmental and somatic dysfunction of thoracic region: Secondary | ICD-10-CM | POA: Diagnosis not present

## 2018-01-27 DIAGNOSIS — M9901 Segmental and somatic dysfunction of cervical region: Secondary | ICD-10-CM | POA: Diagnosis not present

## 2018-01-27 DIAGNOSIS — M5386 Other specified dorsopathies, lumbar region: Secondary | ICD-10-CM | POA: Diagnosis not present

## 2018-01-27 DIAGNOSIS — M9903 Segmental and somatic dysfunction of lumbar region: Secondary | ICD-10-CM | POA: Diagnosis not present

## 2018-01-28 DIAGNOSIS — M9901 Segmental and somatic dysfunction of cervical region: Secondary | ICD-10-CM | POA: Diagnosis not present

## 2018-01-28 DIAGNOSIS — M9902 Segmental and somatic dysfunction of thoracic region: Secondary | ICD-10-CM | POA: Diagnosis not present

## 2018-01-28 DIAGNOSIS — M5386 Other specified dorsopathies, lumbar region: Secondary | ICD-10-CM | POA: Diagnosis not present

## 2018-01-28 DIAGNOSIS — M9903 Segmental and somatic dysfunction of lumbar region: Secondary | ICD-10-CM | POA: Diagnosis not present

## 2018-02-01 DIAGNOSIS — M9901 Segmental and somatic dysfunction of cervical region: Secondary | ICD-10-CM | POA: Diagnosis not present

## 2018-02-01 DIAGNOSIS — M5386 Other specified dorsopathies, lumbar region: Secondary | ICD-10-CM | POA: Diagnosis not present

## 2018-02-01 DIAGNOSIS — M9902 Segmental and somatic dysfunction of thoracic region: Secondary | ICD-10-CM | POA: Diagnosis not present

## 2018-02-01 DIAGNOSIS — M9903 Segmental and somatic dysfunction of lumbar region: Secondary | ICD-10-CM | POA: Diagnosis not present

## 2018-02-03 DIAGNOSIS — M9901 Segmental and somatic dysfunction of cervical region: Secondary | ICD-10-CM | POA: Diagnosis not present

## 2018-02-03 DIAGNOSIS — M9903 Segmental and somatic dysfunction of lumbar region: Secondary | ICD-10-CM | POA: Diagnosis not present

## 2018-02-03 DIAGNOSIS — M9902 Segmental and somatic dysfunction of thoracic region: Secondary | ICD-10-CM | POA: Diagnosis not present

## 2018-02-03 DIAGNOSIS — M5386 Other specified dorsopathies, lumbar region: Secondary | ICD-10-CM | POA: Diagnosis not present

## 2018-02-08 DIAGNOSIS — M9901 Segmental and somatic dysfunction of cervical region: Secondary | ICD-10-CM | POA: Diagnosis not present

## 2018-02-08 DIAGNOSIS — M9902 Segmental and somatic dysfunction of thoracic region: Secondary | ICD-10-CM | POA: Diagnosis not present

## 2018-02-08 DIAGNOSIS — M5386 Other specified dorsopathies, lumbar region: Secondary | ICD-10-CM | POA: Diagnosis not present

## 2018-02-08 DIAGNOSIS — M9903 Segmental and somatic dysfunction of lumbar region: Secondary | ICD-10-CM | POA: Diagnosis not present

## 2018-02-22 DIAGNOSIS — M9901 Segmental and somatic dysfunction of cervical region: Secondary | ICD-10-CM | POA: Diagnosis not present

## 2018-02-22 DIAGNOSIS — M9902 Segmental and somatic dysfunction of thoracic region: Secondary | ICD-10-CM | POA: Diagnosis not present

## 2018-02-22 DIAGNOSIS — M9903 Segmental and somatic dysfunction of lumbar region: Secondary | ICD-10-CM | POA: Diagnosis not present

## 2018-02-22 DIAGNOSIS — M5386 Other specified dorsopathies, lumbar region: Secondary | ICD-10-CM | POA: Diagnosis not present

## 2018-02-24 DIAGNOSIS — M5386 Other specified dorsopathies, lumbar region: Secondary | ICD-10-CM | POA: Diagnosis not present

## 2018-02-24 DIAGNOSIS — M9901 Segmental and somatic dysfunction of cervical region: Secondary | ICD-10-CM | POA: Diagnosis not present

## 2018-02-24 DIAGNOSIS — M9903 Segmental and somatic dysfunction of lumbar region: Secondary | ICD-10-CM | POA: Diagnosis not present

## 2018-02-24 DIAGNOSIS — M9902 Segmental and somatic dysfunction of thoracic region: Secondary | ICD-10-CM | POA: Diagnosis not present

## 2018-03-01 DIAGNOSIS — M5386 Other specified dorsopathies, lumbar region: Secondary | ICD-10-CM | POA: Diagnosis not present

## 2018-03-01 DIAGNOSIS — M9903 Segmental and somatic dysfunction of lumbar region: Secondary | ICD-10-CM | POA: Diagnosis not present

## 2018-03-01 DIAGNOSIS — M9902 Segmental and somatic dysfunction of thoracic region: Secondary | ICD-10-CM | POA: Diagnosis not present

## 2018-03-01 DIAGNOSIS — M9901 Segmental and somatic dysfunction of cervical region: Secondary | ICD-10-CM | POA: Diagnosis not present

## 2018-03-03 DIAGNOSIS — M5386 Other specified dorsopathies, lumbar region: Secondary | ICD-10-CM | POA: Diagnosis not present

## 2018-03-03 DIAGNOSIS — M9901 Segmental and somatic dysfunction of cervical region: Secondary | ICD-10-CM | POA: Diagnosis not present

## 2018-03-03 DIAGNOSIS — M9903 Segmental and somatic dysfunction of lumbar region: Secondary | ICD-10-CM | POA: Diagnosis not present

## 2018-03-03 DIAGNOSIS — M9902 Segmental and somatic dysfunction of thoracic region: Secondary | ICD-10-CM | POA: Diagnosis not present

## 2018-03-04 DIAGNOSIS — F411 Generalized anxiety disorder: Secondary | ICD-10-CM | POA: Diagnosis not present

## 2018-03-04 DIAGNOSIS — F330 Major depressive disorder, recurrent, mild: Secondary | ICD-10-CM | POA: Diagnosis not present

## 2018-03-24 DIAGNOSIS — M5386 Other specified dorsopathies, lumbar region: Secondary | ICD-10-CM | POA: Diagnosis not present

## 2018-03-24 DIAGNOSIS — M9903 Segmental and somatic dysfunction of lumbar region: Secondary | ICD-10-CM | POA: Diagnosis not present

## 2018-03-24 DIAGNOSIS — M9901 Segmental and somatic dysfunction of cervical region: Secondary | ICD-10-CM | POA: Diagnosis not present

## 2018-03-24 DIAGNOSIS — M9902 Segmental and somatic dysfunction of thoracic region: Secondary | ICD-10-CM | POA: Diagnosis not present

## 2018-05-17 DIAGNOSIS — F330 Major depressive disorder, recurrent, mild: Secondary | ICD-10-CM | POA: Diagnosis not present

## 2018-05-17 DIAGNOSIS — F411 Generalized anxiety disorder: Secondary | ICD-10-CM | POA: Diagnosis not present

## 2018-05-27 DIAGNOSIS — F411 Generalized anxiety disorder: Secondary | ICD-10-CM | POA: Diagnosis not present

## 2018-05-27 DIAGNOSIS — F330 Major depressive disorder, recurrent, mild: Secondary | ICD-10-CM | POA: Diagnosis not present

## 2018-06-14 DIAGNOSIS — F331 Major depressive disorder, recurrent, moderate: Secondary | ICD-10-CM | POA: Diagnosis not present

## 2018-06-14 DIAGNOSIS — F411 Generalized anxiety disorder: Secondary | ICD-10-CM | POA: Diagnosis not present

## 2018-10-07 DIAGNOSIS — K219 Gastro-esophageal reflux disease without esophagitis: Secondary | ICD-10-CM | POA: Diagnosis not present

## 2018-10-07 DIAGNOSIS — R0789 Other chest pain: Secondary | ICD-10-CM | POA: Diagnosis not present

## 2018-10-08 ENCOUNTER — Other Ambulatory Visit: Payer: Self-pay | Admitting: Family Medicine

## 2018-10-08 ENCOUNTER — Ambulatory Visit
Admission: RE | Admit: 2018-10-08 | Discharge: 2018-10-08 | Disposition: A | Discharge status: Home / Self Care | Payer: BLUE CROSS/BLUE SHIELD | Source: Ambulatory Visit | Attending: Family Medicine | Admitting: Family Medicine

## 2018-10-08 DIAGNOSIS — R0789 Other chest pain: Secondary | ICD-10-CM
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
## 2019-05-12 DIAGNOSIS — K219 Gastro-esophageal reflux disease without esophagitis: Secondary | ICD-10-CM | POA: Diagnosis not present

## 2019-05-12 DIAGNOSIS — F17210 Nicotine dependence, cigarettes, uncomplicated: Secondary | ICD-10-CM | POA: Diagnosis not present

## 2019-05-12 DIAGNOSIS — Z87898 Personal history of other specified conditions: Secondary | ICD-10-CM | POA: Diagnosis not present

## 2019-05-13 ENCOUNTER — Other Ambulatory Visit: Payer: Self-pay | Admitting: Otolaryngology

## 2019-05-13 DIAGNOSIS — K219 Gastro-esophageal reflux disease without esophagitis: Secondary | ICD-10-CM

## 2019-05-13 DIAGNOSIS — F1911 Other psychoactive substance abuse, in remission: Secondary | ICD-10-CM

## 2019-05-19 ENCOUNTER — Ambulatory Visit
Admission: RE | Admit: 2019-05-19 | Discharge: 2019-05-19 | Disposition: A | Discharge status: Home / Self Care | Payer: BC Managed Care – PPO | Source: Ambulatory Visit | Attending: Otolaryngology | Admitting: Otolaryngology

## 2019-05-19 DIAGNOSIS — K224 Dyskinesia of esophagus: Secondary | ICD-10-CM | POA: Diagnosis not present

## 2019-05-19 DIAGNOSIS — F1911 Other psychoactive substance abuse, in remission: Secondary | ICD-10-CM

## 2019-05-19 DIAGNOSIS — K219 Gastro-esophageal reflux disease without esophagitis: Secondary | ICD-10-CM

## 2019-05-19 DIAGNOSIS — K449 Diaphragmatic hernia without obstruction or gangrene: Secondary | ICD-10-CM | POA: Diagnosis not present

## 2019-07-06 DIAGNOSIS — M9903 Segmental and somatic dysfunction of lumbar region: Secondary | ICD-10-CM | POA: Diagnosis not present

## 2019-07-06 DIAGNOSIS — M9901 Segmental and somatic dysfunction of cervical region: Secondary | ICD-10-CM | POA: Diagnosis not present

## 2019-07-06 DIAGNOSIS — M9902 Segmental and somatic dysfunction of thoracic region: Secondary | ICD-10-CM | POA: Diagnosis not present

## 2019-07-06 DIAGNOSIS — M5386 Other specified dorsopathies, lumbar region: Secondary | ICD-10-CM | POA: Diagnosis not present

## 2019-07-07 DIAGNOSIS — F458 Other somatoform disorders: Secondary | ICD-10-CM | POA: Diagnosis not present

## 2019-07-07 DIAGNOSIS — S060X9D Concussion with loss of consciousness of unspecified duration, subsequent encounter: Secondary | ICD-10-CM | POA: Diagnosis not present

## 2019-07-11 DIAGNOSIS — M9903 Segmental and somatic dysfunction of lumbar region: Secondary | ICD-10-CM | POA: Diagnosis not present

## 2019-07-11 DIAGNOSIS — M5386 Other specified dorsopathies, lumbar region: Secondary | ICD-10-CM | POA: Diagnosis not present

## 2019-07-11 DIAGNOSIS — M9902 Segmental and somatic dysfunction of thoracic region: Secondary | ICD-10-CM | POA: Diagnosis not present

## 2019-07-11 DIAGNOSIS — M9901 Segmental and somatic dysfunction of cervical region: Secondary | ICD-10-CM | POA: Diagnosis not present

## 2019-07-14 DIAGNOSIS — M9901 Segmental and somatic dysfunction of cervical region: Secondary | ICD-10-CM | POA: Diagnosis not present

## 2019-07-14 DIAGNOSIS — M9902 Segmental and somatic dysfunction of thoracic region: Secondary | ICD-10-CM | POA: Diagnosis not present

## 2019-07-14 DIAGNOSIS — M9903 Segmental and somatic dysfunction of lumbar region: Secondary | ICD-10-CM | POA: Diagnosis not present

## 2019-07-14 DIAGNOSIS — M5386 Other specified dorsopathies, lumbar region: Secondary | ICD-10-CM | POA: Diagnosis not present

## 2019-07-21 DIAGNOSIS — M9903 Segmental and somatic dysfunction of lumbar region: Secondary | ICD-10-CM | POA: Diagnosis not present

## 2019-07-21 DIAGNOSIS — M9901 Segmental and somatic dysfunction of cervical region: Secondary | ICD-10-CM | POA: Diagnosis not present

## 2019-07-21 DIAGNOSIS — M5386 Other specified dorsopathies, lumbar region: Secondary | ICD-10-CM | POA: Diagnosis not present

## 2019-07-21 DIAGNOSIS — M9902 Segmental and somatic dysfunction of thoracic region: Secondary | ICD-10-CM | POA: Diagnosis not present

## 2019-07-29 DIAGNOSIS — M9903 Segmental and somatic dysfunction of lumbar region: Secondary | ICD-10-CM | POA: Diagnosis not present

## 2019-07-29 DIAGNOSIS — M9901 Segmental and somatic dysfunction of cervical region: Secondary | ICD-10-CM | POA: Diagnosis not present

## 2019-07-29 DIAGNOSIS — M5386 Other specified dorsopathies, lumbar region: Secondary | ICD-10-CM | POA: Diagnosis not present

## 2019-07-29 DIAGNOSIS — M9902 Segmental and somatic dysfunction of thoracic region: Secondary | ICD-10-CM | POA: Diagnosis not present

## 2019-08-01 DIAGNOSIS — M5386 Other specified dorsopathies, lumbar region: Secondary | ICD-10-CM | POA: Diagnosis not present

## 2019-08-01 DIAGNOSIS — M9902 Segmental and somatic dysfunction of thoracic region: Secondary | ICD-10-CM | POA: Diagnosis not present

## 2019-08-01 DIAGNOSIS — M9903 Segmental and somatic dysfunction of lumbar region: Secondary | ICD-10-CM | POA: Diagnosis not present

## 2019-08-01 DIAGNOSIS — M9901 Segmental and somatic dysfunction of cervical region: Secondary | ICD-10-CM | POA: Diagnosis not present

## 2019-08-04 DIAGNOSIS — M9902 Segmental and somatic dysfunction of thoracic region: Secondary | ICD-10-CM | POA: Diagnosis not present

## 2019-08-04 DIAGNOSIS — M9903 Segmental and somatic dysfunction of lumbar region: Secondary | ICD-10-CM | POA: Diagnosis not present

## 2019-08-04 DIAGNOSIS — M5386 Other specified dorsopathies, lumbar region: Secondary | ICD-10-CM | POA: Diagnosis not present

## 2019-08-04 DIAGNOSIS — M9901 Segmental and somatic dysfunction of cervical region: Secondary | ICD-10-CM | POA: Diagnosis not present

## 2019-08-10 DIAGNOSIS — M9903 Segmental and somatic dysfunction of lumbar region: Secondary | ICD-10-CM | POA: Diagnosis not present

## 2019-08-10 DIAGNOSIS — M9902 Segmental and somatic dysfunction of thoracic region: Secondary | ICD-10-CM | POA: Diagnosis not present

## 2019-08-10 DIAGNOSIS — M9901 Segmental and somatic dysfunction of cervical region: Secondary | ICD-10-CM | POA: Diagnosis not present

## 2019-08-10 DIAGNOSIS — M5386 Other specified dorsopathies, lumbar region: Secondary | ICD-10-CM | POA: Diagnosis not present

## 2019-08-15 DIAGNOSIS — M9901 Segmental and somatic dysfunction of cervical region: Secondary | ICD-10-CM | POA: Diagnosis not present

## 2019-08-15 DIAGNOSIS — M5386 Other specified dorsopathies, lumbar region: Secondary | ICD-10-CM | POA: Diagnosis not present

## 2019-08-15 DIAGNOSIS — M9902 Segmental and somatic dysfunction of thoracic region: Secondary | ICD-10-CM | POA: Diagnosis not present

## 2019-08-15 DIAGNOSIS — M9903 Segmental and somatic dysfunction of lumbar region: Secondary | ICD-10-CM | POA: Diagnosis not present

## 2019-08-19 DIAGNOSIS — M9901 Segmental and somatic dysfunction of cervical region: Secondary | ICD-10-CM | POA: Diagnosis not present

## 2019-08-19 DIAGNOSIS — M9903 Segmental and somatic dysfunction of lumbar region: Secondary | ICD-10-CM | POA: Diagnosis not present

## 2019-08-19 DIAGNOSIS — M5386 Other specified dorsopathies, lumbar region: Secondary | ICD-10-CM | POA: Diagnosis not present

## 2019-08-19 DIAGNOSIS — M9902 Segmental and somatic dysfunction of thoracic region: Secondary | ICD-10-CM | POA: Diagnosis not present

## 2019-08-23 DIAGNOSIS — M9901 Segmental and somatic dysfunction of cervical region: Secondary | ICD-10-CM | POA: Diagnosis not present

## 2019-08-23 DIAGNOSIS — M5386 Other specified dorsopathies, lumbar region: Secondary | ICD-10-CM | POA: Diagnosis not present

## 2019-08-23 DIAGNOSIS — M9903 Segmental and somatic dysfunction of lumbar region: Secondary | ICD-10-CM | POA: Diagnosis not present

## 2019-08-23 DIAGNOSIS — M9902 Segmental and somatic dysfunction of thoracic region: Secondary | ICD-10-CM | POA: Diagnosis not present

## 2019-08-25 DIAGNOSIS — M5386 Other specified dorsopathies, lumbar region: Secondary | ICD-10-CM | POA: Diagnosis not present

## 2019-08-25 DIAGNOSIS — M9903 Segmental and somatic dysfunction of lumbar region: Secondary | ICD-10-CM | POA: Diagnosis not present

## 2019-08-25 DIAGNOSIS — M9901 Segmental and somatic dysfunction of cervical region: Secondary | ICD-10-CM | POA: Diagnosis not present

## 2019-08-25 DIAGNOSIS — M9902 Segmental and somatic dysfunction of thoracic region: Secondary | ICD-10-CM | POA: Diagnosis not present

## 2019-08-31 DIAGNOSIS — M9903 Segmental and somatic dysfunction of lumbar region: Secondary | ICD-10-CM | POA: Diagnosis not present

## 2019-08-31 DIAGNOSIS — M9902 Segmental and somatic dysfunction of thoracic region: Secondary | ICD-10-CM | POA: Diagnosis not present

## 2019-08-31 DIAGNOSIS — M5386 Other specified dorsopathies, lumbar region: Secondary | ICD-10-CM | POA: Diagnosis not present

## 2019-08-31 DIAGNOSIS — M9901 Segmental and somatic dysfunction of cervical region: Secondary | ICD-10-CM | POA: Diagnosis not present

## 2019-09-06 DIAGNOSIS — M9903 Segmental and somatic dysfunction of lumbar region: Secondary | ICD-10-CM | POA: Diagnosis not present

## 2019-09-06 DIAGNOSIS — M9901 Segmental and somatic dysfunction of cervical region: Secondary | ICD-10-CM | POA: Diagnosis not present

## 2019-09-06 DIAGNOSIS — M5386 Other specified dorsopathies, lumbar region: Secondary | ICD-10-CM | POA: Diagnosis not present

## 2019-09-06 DIAGNOSIS — M9902 Segmental and somatic dysfunction of thoracic region: Secondary | ICD-10-CM | POA: Diagnosis not present

## 2020-01-20 DIAGNOSIS — M9905 Segmental and somatic dysfunction of pelvic region: Secondary | ICD-10-CM | POA: Diagnosis not present

## 2020-01-20 DIAGNOSIS — M9903 Segmental and somatic dysfunction of lumbar region: Secondary | ICD-10-CM | POA: Diagnosis not present

## 2020-01-20 DIAGNOSIS — M9902 Segmental and somatic dysfunction of thoracic region: Secondary | ICD-10-CM | POA: Diagnosis not present

## 2020-01-20 DIAGNOSIS — M9901 Segmental and somatic dysfunction of cervical region: Secondary | ICD-10-CM | POA: Diagnosis not present

## 2020-01-26 DIAGNOSIS — M9901 Segmental and somatic dysfunction of cervical region: Secondary | ICD-10-CM | POA: Diagnosis not present

## 2020-01-26 DIAGNOSIS — M9903 Segmental and somatic dysfunction of lumbar region: Secondary | ICD-10-CM | POA: Diagnosis not present

## 2020-01-26 DIAGNOSIS — M9905 Segmental and somatic dysfunction of pelvic region: Secondary | ICD-10-CM | POA: Diagnosis not present

## 2020-01-26 DIAGNOSIS — M9902 Segmental and somatic dysfunction of thoracic region: Secondary | ICD-10-CM | POA: Diagnosis not present

## 2020-02-09 DIAGNOSIS — M9903 Segmental and somatic dysfunction of lumbar region: Secondary | ICD-10-CM | POA: Diagnosis not present

## 2020-02-09 DIAGNOSIS — M9905 Segmental and somatic dysfunction of pelvic region: Secondary | ICD-10-CM | POA: Diagnosis not present

## 2020-02-09 DIAGNOSIS — M9902 Segmental and somatic dysfunction of thoracic region: Secondary | ICD-10-CM | POA: Diagnosis not present

## 2020-02-09 DIAGNOSIS — M9901 Segmental and somatic dysfunction of cervical region: Secondary | ICD-10-CM | POA: Diagnosis not present

## 2020-02-22 DIAGNOSIS — M9903 Segmental and somatic dysfunction of lumbar region: Secondary | ICD-10-CM | POA: Diagnosis not present

## 2020-02-22 DIAGNOSIS — M9905 Segmental and somatic dysfunction of pelvic region: Secondary | ICD-10-CM | POA: Diagnosis not present

## 2020-02-22 DIAGNOSIS — M9902 Segmental and somatic dysfunction of thoracic region: Secondary | ICD-10-CM | POA: Diagnosis not present

## 2020-02-22 DIAGNOSIS — M9901 Segmental and somatic dysfunction of cervical region: Secondary | ICD-10-CM | POA: Diagnosis not present

## 2020-03-08 DIAGNOSIS — M9902 Segmental and somatic dysfunction of thoracic region: Secondary | ICD-10-CM | POA: Diagnosis not present

## 2020-03-08 DIAGNOSIS — M9903 Segmental and somatic dysfunction of lumbar region: Secondary | ICD-10-CM | POA: Diagnosis not present

## 2020-03-08 DIAGNOSIS — M9901 Segmental and somatic dysfunction of cervical region: Secondary | ICD-10-CM | POA: Diagnosis not present

## 2020-03-08 DIAGNOSIS — M9905 Segmental and somatic dysfunction of pelvic region: Secondary | ICD-10-CM | POA: Diagnosis not present

## 2020-03-22 DIAGNOSIS — M9905 Segmental and somatic dysfunction of pelvic region: Secondary | ICD-10-CM | POA: Diagnosis not present

## 2020-03-22 DIAGNOSIS — M9902 Segmental and somatic dysfunction of thoracic region: Secondary | ICD-10-CM | POA: Diagnosis not present

## 2020-03-22 DIAGNOSIS — M9903 Segmental and somatic dysfunction of lumbar region: Secondary | ICD-10-CM | POA: Diagnosis not present

## 2020-03-22 DIAGNOSIS — M9901 Segmental and somatic dysfunction of cervical region: Secondary | ICD-10-CM | POA: Diagnosis not present

## 2020-04-05 DIAGNOSIS — M9902 Segmental and somatic dysfunction of thoracic region: Secondary | ICD-10-CM | POA: Diagnosis not present

## 2020-04-05 DIAGNOSIS — M9905 Segmental and somatic dysfunction of pelvic region: Secondary | ICD-10-CM | POA: Diagnosis not present

## 2020-04-05 DIAGNOSIS — M9901 Segmental and somatic dysfunction of cervical region: Secondary | ICD-10-CM | POA: Diagnosis not present

## 2020-04-05 DIAGNOSIS — M9903 Segmental and somatic dysfunction of lumbar region: Secondary | ICD-10-CM | POA: Diagnosis not present

## 2020-04-09 ENCOUNTER — Other Ambulatory Visit: Payer: Self-pay

## 2020-04-09 ENCOUNTER — Emergency Department (HOSPITAL_BASED_OUTPATIENT_CLINIC_OR_DEPARTMENT_OTHER)
Admission: EM | Admit: 2020-04-09 | Discharge: 2020-04-09 | Disposition: A | Discharge status: Home / Self Care | Payer: BC Managed Care – PPO | Attending: Emergency Medicine | Admitting: Emergency Medicine

## 2020-04-09 ENCOUNTER — Emergency Department (HOSPITAL_BASED_OUTPATIENT_CLINIC_OR_DEPARTMENT_OTHER): Payer: BC Managed Care – PPO

## 2020-04-09 ENCOUNTER — Encounter (HOSPITAL_BASED_OUTPATIENT_CLINIC_OR_DEPARTMENT_OTHER): Payer: Self-pay

## 2020-04-09 DIAGNOSIS — R1110 Vomiting, unspecified: Secondary | ICD-10-CM | POA: Diagnosis not present

## 2020-04-09 DIAGNOSIS — G44229 Chronic tension-type headache, not intractable: Secondary | ICD-10-CM | POA: Insufficient documentation

## 2020-04-09 DIAGNOSIS — Z880 Allergy status to penicillin: Secondary | ICD-10-CM | POA: Diagnosis not present

## 2020-04-09 DIAGNOSIS — Z87891 Personal history of nicotine dependence: Secondary | ICD-10-CM | POA: Insufficient documentation

## 2020-04-09 DIAGNOSIS — Z79899 Other long term (current) drug therapy: Secondary | ICD-10-CM | POA: Insufficient documentation

## 2020-04-09 DIAGNOSIS — R110 Nausea: Secondary | ICD-10-CM | POA: Diagnosis not present

## 2020-04-09 DIAGNOSIS — J3489 Other specified disorders of nose and nasal sinuses: Secondary | ICD-10-CM

## 2020-04-09 DIAGNOSIS — R0981 Nasal congestion: Secondary | ICD-10-CM | POA: Diagnosis not present

## 2020-04-09 DIAGNOSIS — R519 Headache, unspecified: Secondary | ICD-10-CM | POA: Diagnosis not present

## 2020-04-09 DIAGNOSIS — R1013 Epigastric pain: Secondary | ICD-10-CM | POA: Insufficient documentation

## 2020-04-09 LAB — CBC WITH DIFFERENTIAL/PLATELET
Abs Immature Granulocytes: 0.06 10*3/uL (ref 0.00–0.07)
Basophils Absolute: 0 10*3/uL (ref 0.0–0.1)
Basophils Relative: 0 %
Eosinophils Absolute: 0 10*3/uL (ref 0.0–0.5)
Eosinophils Relative: 0 %
HCT: 38.3 % — ABNORMAL LOW (ref 39.0–52.0)
Hemoglobin: 13 g/dL (ref 13.0–17.0)
Immature Granulocytes: 0 %
Lymphocytes Relative: 9 %
Lymphs Abs: 1.2 10*3/uL (ref 0.7–4.0)
MCH: 28.5 pg (ref 26.0–34.0)
MCHC: 33.9 g/dL (ref 30.0–36.0)
MCV: 84 fL (ref 80.0–100.0)
Monocytes Absolute: 0.5 10*3/uL (ref 0.1–1.0)
Monocytes Relative: 4 %
Neutro Abs: 12.2 10*3/uL — ABNORMAL HIGH (ref 1.7–7.7)
Neutrophils Relative %: 87 %
Platelets: 284 10*3/uL (ref 150–400)
RBC: 4.56 MIL/uL (ref 4.22–5.81)
RDW: 11.9 % (ref 11.5–15.5)
WBC: 14.1 10*3/uL — ABNORMAL HIGH (ref 4.0–10.5)
nRBC: 0 % (ref 0.0–0.2)

## 2020-04-09 LAB — BASIC METABOLIC PANEL
Anion gap: 13 (ref 5–15)
BUN: 10 mg/dL (ref 6–20)
CO2: 24 mmol/L (ref 22–32)
Calcium: 8.9 mg/dL (ref 8.9–10.3)
Chloride: 99 mmol/L (ref 98–111)
Creatinine, Ser: 0.96 mg/dL (ref 0.61–1.24)
GFR calc Af Amer: >60 mL/min (ref >60)
GFR calc non Af Amer: >60 mL/min (ref >60)
Glucose, Bld: 104 mg/dL — ABNORMAL HIGH (ref 70–99)
Potassium: 3.4 mmol/L — ABNORMAL LOW (ref 3.5–5.1)
Sodium: 136 mmol/L (ref 135–145)

## 2020-04-09 MED ORDER — OMEPRAZOLE 20 MG PO CPDR
20.0000 mg | DELAYED_RELEASE_CAPSULE | Freq: Every day | ORAL | 0 refills | Status: DC
Start: 2020-04-09 — End: 2020-04-17

## 2020-04-09 MED ORDER — CETIRIZINE HCL 10 MG PO TABS: 10.0000 mg | ORAL_TABLET | Freq: Every day | ORAL | 0 refills | Status: AC

## 2020-04-09 MED ORDER — ONDANSETRON 4 MG PO TBDP: 4.0000 mg | ORAL_TABLET | Freq: Three times a day (TID) | ORAL | 0 refills | Status: DC | PRN

## 2020-04-09 NOTE — ED Notes (Signed)
ED Provider at bedside. 

## 2020-04-09 NOTE — ED Triage Notes (Signed)
Pt reports history of cocaine abuse states that he has been having nasal drainage from that but denies any use "in years"
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
## 2020-04-09 NOTE — Discharge Instructions (Signed)
I have written you for some Zofran to help with your nausea.  I have written you for Pepcid to help with your reflux  I have written you for Zyrtec to help with your runny nose  Follow-up with the specialist listed above.  You need to call to schedule appointment.  Return for any worsening symptoms.

## 2020-04-09 NOTE — ED Provider Notes (Signed)
[facility]ERG[facility] DEPARTMENT Provider Note   CSN: [id number] Arrival date & time: 04/09/20  1137   History Multiple complaints  Mark Kirk Mark Kirk is a 24 y.o. male with past medical history significant for bipolar, anxiety, depression, bradycardia, palpitations who presents for evaluation multiple complaints.  Patient states he has had intermittent headaches since he was 18.  Apparently had a concussion after falling.  States he was initially seen by neurology and had MRI and CT scan outpatient however he does not feel that he needs to be reassessed by neurology.  Denies any current headache.  No recent injury or trauma.  No paresthesias, unilateral weakness, slurred speech, neck pain, neck stiffness.  Patient also with intermittent congestion and rhinorrhea.  States he has yellow and intermittent clear rhinorrhea.  Known history of allergies and is not taking medication.  No bloody rhinorrhea, sinus pain or pressure.  No sore throat.  No fever or chills. Chronic daily cough x years.  Patient also with nausea.  States he has history of hiatal hernia.  Has been seen by surgery outpatient apparently however does not want correction.  Also has intermittent epigastric pain worse with greasy or spicy food intake.  He denies any right upper quadrant abdominal pain.  Uses marijuana multiple times daily.  No diarrhea or constipation.  He is passing flatulence.  Prior abdominal surgeries.  No additional aggravating or relieving factors.  History obtained from patient, Mother in room and past medical records.  No interpreter is used.  Admits to prior cocaine use "years ago." None in the last "5 years."  HPI     Past Medical History:  Diagnosis Date  . Allergic rhinitis   . Anxiety   . Bipolar 1 disorder ([place])   . Bradycardia   . Depression   . PAC (premature atrial contraction)   . Palpitations   . Palpitations     Patient Active Problem List   Diagnosis Date Noted  . Sinus  bradycardia 11/05/2016  . PAC (premature atrial contraction) 11/05/2016  . Severe single current episode of major depressive disorder, without psychotic features ([place])   . Overdose   . Major depressive disorder 05/21/2016    Past Surgical History:  Procedure Laterality Date  . FRACTURE SURGERY         No family history on file.  Social History   Tobacco Use  . Smoking status: Former Smoker    Types: Cigarettes  . Smokeless tobacco: Never Used  Substance Use Topics  . Alcohol use: Yes  . Drug use: Yes    Types: Marijuana, Cocaine    Home Medications Prior to Admission medications   Medication Sig Start Date End Date Taking? Authorizing Provider  cetirizine (ZYRTEC ALLERGY) 10 MG tablet Take 1 tablet (10 mg total) by mouth daily. 04/09/20   Jehiel Koepp A, PA-C  citalopram (CELEXA) 10 MG tablet Take 1 tablet (10 mg total) by mouth daily. Patient not taking: Reported on 07/27/2017 05/26/16   Kerrie Buffalo, NP  omeprazole (PRILOSEC) 20 MG capsule Take 1 capsule (20 mg total) by mouth daily. 04/09/20   Bushra Denman A, PA-C  ondansetron (ZOFRAN ODT) 4 MG disintegrating tablet Take 1 tablet (4 mg total) by mouth every 8 (eight) hours as needed for nausea or vomiting. 04/09/20   Bethany Hirt A, PA-C    Allergies    Amoxicillin  Review of Systems   Review of Systems  Constitutional: Negative.   HENT: Positive for congestion, postnasal drip and rhinorrhea.  Respiratory: Positive for cough. Negative for apnea, choking, chest tightness, shortness of breath, wheezing and stridor.   Cardiovascular: Negative.   Gastrointestinal: Positive for abdominal pain (Intermittent epigastric pain, none currently) and nausea. Negative for abdominal distention, anal bleeding, blood in stool, constipation, diarrhea, rectal pain and vomiting.  Genitourinary: Negative.   Musculoskeletal: Negative.   Skin: Negative.   Neurological: Positive for headaches (Intermittent, chronic). Negative  for dizziness, tremors, seizures, syncope, facial asymmetry, speech difficulty, weakness, light-headedness and numbness.  All other systems reviewed and are negative.   Physical Exam Updated Vital Signs BP 123/73 (BP Location: Left Arm)   Pulse 61   Temp 98.2 F (36.8 C) (Oral)   Resp 16   Ht 5\' 10"  (1.778 m)   Wt 68 kg   SpO2 100%   BMI 21.52 kg/m   Physical Exam Vitals and nursing note reviewed.  Constitutional:      General: He is not in acute distress.    Appearance: He is not ill-appearing, toxic-appearing or diaphoretic.  HENT:     Head: Normocephalic and atraumatic.     Jaw: There is normal jaw occlusion.     Right Ear: Tympanic membrane, ear canal and external ear normal. There is no impacted cerumen. No hemotympanum. Tympanic membrane is not injected, scarred, perforated, erythematous, retracted or bulging.     Left Ear: Tympanic membrane, ear canal and external ear normal. There is no impacted cerumen. No hemotympanum. Tympanic membrane is not injected, scarred, perforated, erythematous, retracted or bulging.     Ears:     Comments: No Mastoid tenderness.    Nose:     Comments: Clear rhinorrhea and congestion to bilateral nares.  No sinus tenderness.    Mouth/Throat:     Comments: Posterior oropharynx clear.  Mucous membranes moist.  Tonsils without erythema or exudate.  Uvula midline without deviation.  No evidence of PTA or RPA.  No drooling, dysphasia or trismus.  Phonation normal. Eyes:     Extraocular Movements: Extraocular movements intact.     Comments: No nystagmus, EOMs intact, PERLLA  Neck:     Trachea: Trachea and phonation normal.     Meningeal: Brudzinski's sign and Kernig's sign absent.     Comments: No Neck stiffness or neck rigidity.  No meningismus.  No cervical lymphadenopathy. Cardiovascular:     Comments: No murmurs rubs or gallops. Pulmonary:     Comments: Clear to auscultation bilaterally without wheeze, rhonchi or rales.  No accessory muscle  usage.  Able speak in full sentences. Abdominal:     Comments: Soft, nontender without rebound or guarding.  No CVA tenderness.  Musculoskeletal:     Comments: Moves all 4 extremities without difficulty.  Lower extremities without edema, erythema or warmth.  Skin:    Comments: Brisk capillary refill.  No rashes or lesions.  Neurological:     Mental Status: He is alert.     Comments: Mental Status:  Alert, oriented, thought content appropriate. Speech fluent without evidence of aphasia. Able to follow 2 step commands without difficulty.  Cranial Nerves:  II:  Peripheral visual fields grossly normal, pupils equal, round, reactive to light III,IV, VI: ptosis not present, extra-ocular motions intact bilaterally  V,VII: smile symmetric, facial light touch sensation equal VIII: hearing grossly normal bilaterally  IX,X: midline uvula rise  XI: bilateral shoulder shrug equal and strong XII: midline tongue extension  Motor:  5/5 in upper and lower extremities bilaterally including strong and equal grip strength and dorsiflexion/plantar flexion Sensory: Pinprick  and light touch normal in all extremities.  Deep Tendon Reflexes: 2+ and symmetric  Cerebellar: normal finger-to-nose with bilateral upper extremities Gait: normal gait and balance CV: distal pulses palpable throughout      ED Results / Procedures / Treatments   Labs (all labs ordered are listed, but only abnormal results are displayed) Labs Reviewed  CBC WITH DIFFERENTIAL/PLATELET - Abnormal; Notable for the following components:      Result Value   WBC 14.1 (*)    HCT 38.3 (*)    Neutro Abs 12.2 (*)    All other components within normal limits  BASIC METABOLIC PANEL - Abnormal; Notable for the following components:   Potassium 3.4 (*)    Glucose, Bld 104 (*)    All other components within normal limits    EKG None  Radiology DG Abdomen Acute W/Chest  Result Date: 04/09/2020 CLINICAL DATA:  Emesis EXAM: DG ABDOMEN  ACUTE W/ 1V CHEST COMPARISON:  None. FINDINGS: There is no evidence of dilated bowel loops or free intraperitoneal air. No radiopaque calculi or other significant radiographic abnormality is seen. Heart size and mediastinal contours are within normal limits. Both lungs are clear. IMPRESSION: Negative abdominal radiographs.  No acute cardiopulmonary disease. Electronically Signed   By: Duanne Guess D.O.   On: 04/09/2020 16:37    Procedures Procedures (including critical care time)  Medications Ordered in ED Medications - No data to display  ED Course  I have reviewed the triage vital signs and the nursing notes.  Pertinent labs & imaging results that were available during my care of the patient were reviewed by me and considered in my medical decision making (see chart for details).  24 year old male who is otherwise well presents for evaluation of multiple complaints.  He is afebrile, nonseptic, non-ill-appearing.  Patient with intermittent congestion and rhinorrhea.  Seasonal allergies however does not believe in medications and will not take anything.  Mild clear rhinorrhea and congestion.  No mastoid pain.  No neck stiffness or neck rigidity.  No meningismus.  Posterior oropharynx clear.  Mucous membranes moist.  No evidence of PTA or RPA.  No recent head trauma to suggest CSF rhinorrhea.  Likley seasonal allergies as he has had this for years.  Recommended over-the-counter symptomatic management.  Patient also with intermittent headache x6 years.  No recent injury or trauma.  Apparently was followed outpatient with neurology had negative MRI and CT scan.  He does not want additional imaging at this time.  He has a nonfocal neuro exam without deficits.  Denies sudden onset thunderclap headache.  Low suspicion for CVA, SAH, dissection, mass, ICH.  He has no current headache on exam today.  Discussed follow-up with neurology or PCP for prophylactic medication.  I do not feel he needs  additional imaging emergently given he has a nonfocal neuro exam.  Patient also with intermittent epigastric and emesis times years.  Was also followed by GI was prescribed PPI however again patient states he does not take medications and did not want to follow-up with them.  He is chronic daily marijuana user.  Supposedly has history of hiatal hernia, was seen by general surgery at 1 point however patient did not want surgical intervention.  Tolerating p.o. intake without difficulty.  Has not flatulence and having "normal" bowel movements daily.  Abdomen soft, nontender.  Negative Murphy sign.  No rebound or guarding.  Low suspicion for acute abdomen.  Labs and imaging personally reviewed and interpreted: CBC with leukocytosis at  14.1, Hgb 13.0 Metabolic panel with mild hypokalemia at 3.4 DG chest/abd any evidence of acute cardiopulmonary or abdominal etiology.  Patient reassessed.  Sleeping soundly.  Tolerating p.o. intake.  Reevaluation abdomen soft, nontender.  Nonsurgical abdomen.  Discussed labs and imaging with patient and mother in room.  Patient states "I know I have a large hiatal hernia because [employer] told me."  Discuss did not find this on his imaging here today.  Discussed possible symptoms related to reflux.  He again denies any medication.  Refer him outpatient to GI as well as neurology. Symptoms seem chronic in nature as they have been occurring x years.  The patient has been appropriately medically screened and/or stabilized in the ED. I have low suspicion for any other emergent medical condition which would require further screening, evaluation or treatment in the ED or require inpatient management.  Patient is hemodynamically stable and in no acute distress.  Patient able to ambulate in department prior to ED.  Evaluation does not show acute pathology that would require ongoing or additional emergent interventions while in the emergency department or further inpatient treatment.  I  have discussed the diagnosis with the patient and answered all questions.  Pain is been managed while in the emergency department and patient has no further complaints prior to discharge.  Patient is comfortable with plan discussed in room and is stable for discharge at this time.  I have discussed strict return precautions for returning to the emergency department.  Patient was encouraged to follow-up with PCP/specialist refer to at discharge.    MDM Rules/Calculators/A&P                       Final Clinical Impression(s) / ED Diagnoses Final diagnoses:  Chronic tension-type headache, not intractable  Rhinorrhea    Rx / DC Orders ED Discharge Orders         Ordered    cetirizine (ZYRTEC ALLERGY) 10 MG tablet  Daily     04/09/20 1715    ondansetron (ZOFRAN ODT) 4 MG disintegrating tablet  Every 8 hours PRN     04/09/20 1715    omeprazole (PRILOSEC) 20 MG capsule  Daily     04/09/20 1715           Zonia Caplin A, PA-C 04/09/20 1717    Terrilee Files, MD 04/10/20 1045

## 2020-04-11 ENCOUNTER — Other Ambulatory Visit: Payer: Self-pay

## 2020-04-11 ENCOUNTER — Emergency Department (HOSPITAL_BASED_OUTPATIENT_CLINIC_OR_DEPARTMENT_OTHER)
Admission: EM | Admit: 2020-04-11 | Discharge: 2020-04-12 | Disposition: A | Discharge status: Home / Self Care | Payer: BC Managed Care – PPO | Attending: Emergency Medicine | Admitting: Emergency Medicine

## 2020-04-11 ENCOUNTER — Encounter (HOSPITAL_BASED_OUTPATIENT_CLINIC_OR_DEPARTMENT_OTHER): Payer: Self-pay | Admitting: Emergency Medicine

## 2020-04-11 DIAGNOSIS — Z87891 Personal history of nicotine dependence: Secondary | ICD-10-CM | POA: Diagnosis not present

## 2020-04-11 DIAGNOSIS — Z79899 Other long term (current) drug therapy: Secondary | ICD-10-CM | POA: Diagnosis not present

## 2020-04-11 DIAGNOSIS — R519 Headache, unspecified: Secondary | ICD-10-CM | POA: Insufficient documentation

## 2020-04-11 DIAGNOSIS — Z880 Allergy status to penicillin: Secondary | ICD-10-CM | POA: Diagnosis not present

## 2020-04-11 DIAGNOSIS — M549 Dorsalgia, unspecified: Secondary | ICD-10-CM | POA: Diagnosis not present

## 2020-04-11 DIAGNOSIS — M542 Cervicalgia: Secondary | ICD-10-CM | POA: Diagnosis not present

## 2020-04-11 DIAGNOSIS — Z711 Person with feared health complaint in whom no diagnosis is made: Secondary | ICD-10-CM

## 2020-04-11 DIAGNOSIS — R000 Tachycardia, unspecified: Secondary | ICD-10-CM | POA: Diagnosis not present

## 2020-04-11 DIAGNOSIS — M545 Low back pain: Secondary | ICD-10-CM | POA: Diagnosis not present

## 2020-04-11 DIAGNOSIS — M5489 Other dorsalgia: Secondary | ICD-10-CM | POA: Diagnosis not present

## 2020-04-11 DIAGNOSIS — I959 Hypotension, unspecified: Secondary | ICD-10-CM | POA: Diagnosis not present

## 2020-04-11 DIAGNOSIS — R0902 Hypoxemia: Secondary | ICD-10-CM | POA: Diagnosis not present

## 2020-04-11 NOTE — ED Triage Notes (Signed)
Patient presents via EMS with complaints of chronic back pain; worse in the last week.

## 2020-04-12 ENCOUNTER — Emergency Department (HOSPITAL_BASED_OUTPATIENT_CLINIC_OR_DEPARTMENT_OTHER): Payer: BC Managed Care – PPO

## 2020-04-12 DIAGNOSIS — M549 Dorsalgia, unspecified: Secondary | ICD-10-CM | POA: Diagnosis not present

## 2020-04-12 NOTE — ED Provider Notes (Signed)
[facility] Provider Note: Lowella Dell, MD, FACEP  CSN: [id number] MRN: [id number] ARRIVAL: 04/11/20 at 2116 ROOM: [facility]   CHIEF COMPLAINT  Back Pain   HISTORY OF PRESENT ILLNESS  04/12/20 12:28 AM Mark Kirk is a 24 y.o. male who complains of "severe neurological and physiological pain".  This pain has been going on for 6 years after "a series of accidents" but has worsened recently.  He states his entire body feels "subluxed".  He is requesting a CT scan of the head in anticipation of a scheduled neurology appointment next week.  He complains of pain in various parts of his body including his head, neck and back.  He states he has a hiatal hernia which he can improve by pulling on a formerly broken bone in his right hand which also reduces the subluxation in his right shoulder.   Past Medical History:  Diagnosis Date  . Allergic rhinitis   . Anxiety   . Bipolar 1 disorder ([facility])   . Bradycardia   . Depression   . PAC (premature atrial contraction)   . Palpitations   . Palpitations     Past Surgical History:  Procedure Laterality Date  . FRACTURE SURGERY      History reviewed. No pertinent family history.  Social History   Tobacco Use  . Smoking status: Former Smoker    Types: Cigarettes  . Smokeless tobacco: Never Used  Substance Use Topics  . Alcohol use: Yes  . Drug use: Yes    Types: Marijuana, Cocaine    Prior to Admission medications   Medication Sig Start Date End Date Taking? Authorizing Provider  cetirizine (ZYRTEC ALLERGY) 10 MG tablet Take 1 tablet (10 mg total) by mouth daily. 04/09/20   Henderly, Britni A, PA-C  omeprazole (PRILOSEC) 20 MG capsule Take 1 capsule (20 mg total) by mouth daily. 04/09/20   Henderly, Britni A, PA-C  ondansetron (ZOFRAN ODT) 4 MG disintegrating tablet Take 1 tablet (4 mg total) by mouth every 8 (eight) hours as needed for nausea or vomiting. 04/09/20   Henderly, Britni A, PA-C  citalopram (CELEXA) 10 MG  tablet Take 1 tablet (10 mg total) by mouth daily. Patient not taking: Reported on 07/27/2017 05/26/16 04/12/20  Adonis Brook, NP    Allergies Amoxicillin   REVIEW OF SYSTEMS  Negative except as noted here or in the History of Present Illness.   PHYSICAL EXAMINATION  Initial Vital Signs Blood pressure 122/89, pulse (!) 102, temperature 100.2 F (37.9 C), temperature source Oral, resp. rate 20, height 5\' 10"  (1.778 m), weight 68 kg, SpO2 100 %.  Examination General: Well-developed, well-nourished male in no acute distress; appearance consistent with age of record HENT: normocephalic; atraumatic Eyes: pupils equal, round and reactive to light; extraocular muscles intact Neck: supple Heart: regular rate and rhythm Lungs: clear to auscultation bilaterally Abdomen: soft; nondistended; nontender; bowel sounds present Extremities: No deformity; full range of motion; pulses normal Neurologic: Awake, alert; motor function intact in all extremities and symmetric; no facial droop Skin: Warm and dry Psychiatric: Normal mood and affect   RESULTS  Summary of this visit's results, reviewed and interpreted by myself:   EKG Interpretation  Date/Time:    Ventricular Rate:    PR Interval:    QRS Duration:   QT Interval:    QTC Calculation:   R Axis:     Text Interpretation:        Laboratory Studies: No results found for this or any previous  visit (from the past 24 hour(s)). Imaging Studies: CT Head Wo Contrast  Result Date: 04/12/2020 CLINICAL DATA:  Chronic back pain and odd behavior EXAM: CT HEAD WITHOUT CONTRAST TECHNIQUE: Contiguous axial images were obtained from the base of the skull through the vertex without intravenous contrast. COMPARISON:  July 27, 2017 FINDINGS: Brain: No evidence of acute territorial infarction, hemorrhage, hydrocephalus,extra-axial collection or mass lesion/mass effect. Normal gray-white differentiation. Ventricles are normal in size and contour.  Vascular: No hyperdense vessel or unexpected calcification. Skull: The skull is intact. No fracture or focal lesion identified. Sinuses/Orbits: The visualized paranasal sinuses and mastoid air cells are clear. The orbits and globes intact. Other: None IMPRESSION: No acute intracranial abnormality. Electronically Signed   By: Prudencio Pair M.D.   On: 04/12/2020 01:15    ED COURSE and MDM  Nursing notes, initial and subsequent vitals signs, including pulse oximetry, reviewed and interpreted by myself.  Vitals:   04/11/20 2117 04/11/20 2118 04/11/20 2119  BP: 122/89    Pulse: (!) 102    Resp: 20    Temp: 100.2 F (37.9 C)    TempSrc: Oral    SpO2: 100%    Weight:  68 kg 68 kg  Height:  5\' 10"  (1.778 m) 5\' 10"  (1.778 m)   Medications - No data to display  The patient's symptoms are multifaceted and difficult to place into the correct clinical context in the ED.  He was advised that he should follow-up with his scheduled neurology appointment as neurologist specialize in atypical presentations.  PROCEDURES  Procedures   ED DIAGNOSES     ICD-10-CM   1. Concern about neurological disease without diagnosis  Z71.1        Edwardo Wojnarowski, MD 04/12/20 [phone]

## 2020-04-13 DIAGNOSIS — K219 Gastro-esophageal reflux disease without esophagitis: Secondary | ICD-10-CM | POA: Diagnosis not present

## 2020-04-13 DIAGNOSIS — M549 Dorsalgia, unspecified: Secondary | ICD-10-CM | POA: Diagnosis not present

## 2020-04-13 DIAGNOSIS — F319 Bipolar disorder, unspecified: Secondary | ICD-10-CM | POA: Diagnosis not present

## 2020-04-17 ENCOUNTER — Encounter: Payer: Self-pay | Admitting: Neurology

## 2020-04-17 ENCOUNTER — Other Ambulatory Visit: Payer: Self-pay

## 2020-04-17 ENCOUNTER — Telehealth: Payer: Self-pay | Admitting: Neurology

## 2020-04-17 ENCOUNTER — Ambulatory Visit: Payer: BC Managed Care – PPO | Admitting: Neurology

## 2020-04-17 VITALS — BP 99/68 | HR 107 | Temp 97.4°F | Ht 70.0 in | Wt 135.0 lb

## 2020-04-17 DIAGNOSIS — R519 Headache, unspecified: Secondary | ICD-10-CM

## 2020-04-17 DIAGNOSIS — Z87820 Personal history of traumatic brain injury: Secondary | ICD-10-CM | POA: Diagnosis not present

## 2020-04-17 MED ORDER — CYCLOBENZAPRINE HCL 10 MG PO TABS: 10.0000 mg | ORAL_TABLET | Freq: Every evening | ORAL | 1 refills | Status: AC | PRN

## 2020-04-17 NOTE — Telephone Encounter (Signed)
I have called and left messages with [facility]. [facility] . Waiting for them to call me back to see if they cant take the patient.
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
## 2020-04-17 NOTE — Telephone Encounter (Signed)
I talked to patient he is aware of all details and he is scheduled .

## 2020-04-17 NOTE — Telephone Encounter (Signed)
Called patient and gave his apt time for Concussion clinic . Patient is scheduled for 04/23/2020. I called and relayed information patient  And he under stood details.

## 2020-04-17 NOTE — Patient Instructions (Addendum)
For your recurrent headaches, we will try as needed Flexeril 10 mg, take at bedtime as needed. Side effects may include dizziness, sleepiness, mouth dryness, feeling off balance, so please be mindful. You may not be able to drive after taking it.   I am a little concerned about putting you on daily headache preventative treatment because of potential side effects.  For example, amitriptyline can cause severe mouth dryness in her mouth is quite dry today.  Propranolol which we used often for headache prevention can cause low blood pressure values and your blood pressure is low today.  Topiramate which we also often utilized for migraine prevention often causes appetite loss and weight loss in your weight is on the lower end of the normal spectrum.  Please refrain from taking any illicit drugs including smoking marijuana or vaping THC or taking CBD Gummies, they can cause sleepiness and the muscle relaxer can also cause sleepiness.  Please try to hydrate well with water, 6 to 8 cups/day are recommended, 8 ounces each.  For your back problems and concern for scoliosis, please make a follow-up appointment with your orthopedic surgeon.  Please talk to your primary care physician about additional concerns about your hiatal hernia and esophageal issues.  Please also talk to your primary care physician about any other general health concerns you may have.  As discussed, we will proceed with a brain MRI with and without contrast and call you with the results.  Please follow-up to see one of our nurse practitioners routinely in 3 months.  I will make a referral to a concussion specialist as you report having had at least 2 concussions some years ago.   Please use your eyeglasses more consistently as they have helped with your vision.

## 2020-04-23 ENCOUNTER — Ambulatory Visit: Payer: BC Managed Care – PPO | Admitting: Family Medicine

## 2020-04-25 ENCOUNTER — Telehealth: Payer: Self-pay | Admitting: Neurology

## 2020-04-25 NOTE — Telephone Encounter (Signed)
no to the covid questions MR Brain w/wo contrast Dr. Alinda Sierras Auth: [id number] (exp. 04/24/20 to 05/23/20). Patient is scheduled at [facility] for 05/02/20.

## 2020-05-02 ENCOUNTER — Ambulatory Visit (INDEPENDENT_AMBULATORY_CARE_PROVIDER_SITE_OTHER): Payer: BC Managed Care – PPO

## 2020-05-02 ENCOUNTER — Other Ambulatory Visit: Payer: Self-pay

## 2020-05-02 DIAGNOSIS — R519 Headache, unspecified: Secondary | ICD-10-CM

## 2020-05-02 DIAGNOSIS — Z87820 Personal history of traumatic brain injury: Secondary | ICD-10-CM | POA: Diagnosis not present

## 2020-05-02 MED ORDER — GADOBENATE DIMEGLUMINE 529 MG/ML IV SOLN
15.0000 mL | Freq: Once | INTRAVENOUS | Status: AC | PRN
Start: 2020-05-02 — End: 2020-05-02
  Administered 2020-05-02: 15 mL via INTRAVENOUS

## 2020-05-03 DIAGNOSIS — K449 Diaphragmatic hernia without obstruction or gangrene: Secondary | ICD-10-CM | POA: Diagnosis not present

## 2020-05-03 DIAGNOSIS — R1310 Dysphagia, unspecified: Secondary | ICD-10-CM | POA: Diagnosis not present

## 2020-05-03 DIAGNOSIS — K219 Gastro-esophageal reflux disease without esophagitis: Secondary | ICD-10-CM | POA: Diagnosis not present

## 2020-05-03 NOTE — Progress Notes (Signed)
Please advise patient that his brain MRI with and without contrast was reported as normal.

## 2020-05-07 ENCOUNTER — Telehealth: Payer: Self-pay

## 2020-05-07 NOTE — Telephone Encounter (Signed)
-----   Message from Huston Foley, MD sent at 05/03/2020  4:56 PM EDT ----- Please advise patient that his brain MRI with and without contrast was reported as normal.

## 2020-05-07 NOTE — Telephone Encounter (Signed)
I contacted the pt. No answer, vm was full. Will try again at a later time.

## 2020-05-16 NOTE — Telephone Encounter (Signed)
Pt notified of results and verbalized understanding  

## 2020-05-30 DIAGNOSIS — Z1152 Encounter for screening for COVID-19: Secondary | ICD-10-CM | POA: Diagnosis not present

## 2020-06-04 DIAGNOSIS — K219 Gastro-esophageal reflux disease without esophagitis: Secondary | ICD-10-CM | POA: Diagnosis not present

## 2020-06-04 DIAGNOSIS — R1310 Dysphagia, unspecified: Secondary | ICD-10-CM | POA: Diagnosis not present

## 2020-06-12 DIAGNOSIS — M542 Cervicalgia: Secondary | ICD-10-CM | POA: Diagnosis not present

## 2020-06-12 DIAGNOSIS — M545 Low back pain: Secondary | ICD-10-CM | POA: Diagnosis not present

## 2020-07-05 DIAGNOSIS — K219 Gastro-esophageal reflux disease without esophagitis: Secondary | ICD-10-CM | POA: Diagnosis not present

## 2020-07-25 ENCOUNTER — Ambulatory Visit: Payer: BC Managed Care – PPO | Admitting: Family Medicine

## 2020-07-25 NOTE — Progress Notes (Deleted)
PATIENT: Mark Kirk DOB: Aug 29, 1996  REASON FOR VISIT: follow up HISTORY FROM: patient  No chief complaint on file.    HISTORY OF PRESENT ILLNESS: Today 07/25/20 Mark Kirk is a 24 y.o. male here today for follow up.   HISTORY: (copied from Dr Teofilo Pod note on 04/17/2020)  I saw patient, Mark Kirk, as a referral from the ER for Evaluation of his recurrent headaches.  The patient is accompanied by his mother today.  He is a 30 year old right-handed gentleman with an underlying medical history of palpitations, PACs, depression, bradycardia, bipolar disease, anxiety, and allergic rhinitis, who presented to the emergency room on 04/09/2020 with a history of intermittent headaches since teenage years.  He reported nausea.  Exam was benign.  I reviewed the emergency room records from 04/09/2020 as well as 04/11/2020. He had a head CT without contrast on 04/12/2020 and I reviewed the results: IMPRESSION: No acute intracranial abnormality.  He reports a recurrent headache for the past several years, reports a history of concussion which did not heal completely when he was 18 and another concussion when he was 21.  He feels that he never really let the concussions heal completely.  He was also not taking care of his health as well and reports that he was using cocaine in the past.  He stopped using cocaine.  He also stopped smoking CBD.  Mom reports that he took CBD Gummies last night.  However, patient does not admit to this.  He lives with a roommate.  Mom is also asking him if he took any other pills.  Patient denies taking any pills.  He is not very elaborate in his history.  He does have some other concerns including low back pain, scoliosis, hiatal hernia.  He feels that his vision has been skewed.  He feels that the headache is not a major concern often.  He does not describe any severe throbbing headache.  His back pain bothers him more at times.  He was just seen recently by his eye  doctor about a month ago and mom reports that he got new eyeglasses.  He uses them to drive but does not use them consistently even though they help.  He works as a Civil Service fast streamer for [employer].  He does not drink any daily caffeine and denies alcohol use.  He denies any sudden onset of one-sided weakness or numbness or tingling or droopy face or slurring of speech.  He has had intermittent visual disturbance for 5 years.  He typically tries to hydrate well but has not had any water today.  He saw orthopedics for back pain about 2 years ago he reports.  He also reports having had a brain MRI and a lumbar spine MRI in the past.  He does not take anything for his headaches.  He denies any significant nausea or vomiting.  He also reports that he had a injury to his right hand and never healed properly. His mom reports that he was seen by his counselor recently and the counselor wrote down some of his symptoms.  He reports erectile dysfunction, esophageal problems and hiatal hernia, low back pain, vision problem.   REVIEW OF SYSTEMS: Out of a complete 14 system review of symptoms, the patient complains only of the following symptoms, and all other reviewed systems are negative.  ALLERGIES: Allergies  Allergen Reactions  . Amoxicillin Hives    .Marland KitchenHas patient had a PCN reaction causing immediate rash, facial/tongue/throat swelling, SOB or lightheadedness  with hypotension: UNKNOWN Has patient had a PCN reaction causing severe rash involving mucus membranes or skin necrosis: UNKNOWN Has patient had a PCN reaction that required hospitalization UNKNOWN Has patient had a PCN reaction occurring within the last 10 years:UNKNOWN If all of the above answers are "NO", then may proceed with Cephalosporin use.     HOME MEDICATIONS: Outpatient Medications Prior to Visit  Medication Sig Dispense Refill  . cetirizine (ZYRTEC ALLERGY) 10 MG tablet Take 1 tablet (10 mg total) by mouth daily. 30 tablet 0  .  cyclobenzaprine (FLEXERIL) 10 MG tablet Take 1 tablet (10 mg total) by mouth at bedtime as needed for muscle spasms. 20 tablet 1  . UNABLE TO FIND Med Name: CBD gummies     No facility-administered medications prior to visit.    PAST MEDICAL HISTORY: Past Medical History:  Diagnosis Date  . Allergic rhinitis   . Anxiety   . Bipolar 1 disorder (HCC)   . Bradycardia   . Depression   . PAC (premature atrial contraction)   . Palpitations   . Palpitations     PAST SURGICAL HISTORY: Past Surgical History:  Procedure Laterality Date  . FRACTURE SURGERY      FAMILY HISTORY: No family history on file.  SOCIAL HISTORY: Social History   Socioeconomic History  . Marital status: Single    Spouse name: Not on file  . Number of children: Not on file  . Years of education: Not on file  . Highest education level: Not on file  Occupational History  . Not on file  Tobacco Use  . Smoking status: Former Smoker    Types: Cigarettes  . Smokeless tobacco: Never Used  Vaping Use  . Vaping Use: Some days  Substance and Sexual Activity  . Alcohol use: Yes  . Drug use: Yes    Types: Marijuana, Cocaine  . Sexual activity: Not on file  Other Topics Concern  . Not on file  Social History Narrative   Lives with roommate.    Social Determinants of Health   Financial Resource Strain:   . Difficulty of Paying Living Expenses:   Food Insecurity:   . Worried About Programme researcher, broadcasting/film/video in the Last Year:   . Barista in the Last Year:   Transportation Needs:   . Freight forwarder (Medical):   Marland Kitchen Lack of Transportation (Non-Medical):   Physical Activity:   . Days of Exercise per Week:   . Minutes of Exercise per Session:   Stress:   . Feeling of Stress :   Social Connections:   . Frequency of Communication with Friends and Family:   . Frequency of Social Gatherings with Friends and Family:   . Attends Religious Services:   . Active Member of Clubs or Organizations:   .  Attends Banker Meetings:   Marland Kitchen Marital Status:   Intimate Partner Violence:   . Fear of Current or Ex-Partner:   . Emotionally Abused:   Marland Kitchen Physically Abused:   . Sexually Abused:       PHYSICAL EXAM  There were no vitals filed for this visit. There is no height or weight on file to calculate BMI.  Generalized: Well developed, in no acute distress  Cardiology: normal rate and rhythm, no murmur noted Respiratory: clear to auscultation bilaterally  Neurological examination  Mentation: Alert oriented to time, place, history taking. Follows all commands speech and language fluent Cranial nerve II-XII: Pupils were equal round reactive  to light. Extraocular movements were full, visual field were full on confrontational test. Facial sensation and strength were normal. Uvula tongue midline. Head turning and shoulder shrug  were normal and symmetric. Motor: The motor testing reveals 5 over 5 strength of all 4 extremities. Good symmetric motor tone is noted throughout.  Sensory: Sensory testing is intact to soft touch on all 4 extremities. No evidence of extinction is noted.  Coordination: Cerebellar testing reveals good finger-nose-finger and heel-to-shin bilaterally.  Gait and station: Gait is normal. Tandem gait is normal. Romberg is negative. No drift is seen.  Reflexes: Deep tendon reflexes are symmetric and normal bilaterally.   DIAGNOSTIC DATA (LABS, IMAGING, TESTING) - I reviewed patient records, labs, notes, testing and imaging myself where available.  No flowsheet data found.   Lab Results  Component Value Date   WBC 14.1 (H) 04/09/2020   HGB 13.0 04/09/2020   HCT 38.3 (L) 04/09/2020   MCV 84.0 04/09/2020   PLT 284 04/09/2020      Component Value Date/Time   NA 136 04/09/2020 1627   K 3.4 (L) 04/09/2020 1627   CL 99 04/09/2020 1627   CO2 24 04/09/2020 1627   GLUCOSE 104 (H) 04/09/2020 1627   BUN 10 04/09/2020 1627   CREATININE 0.96 04/09/2020 1627    CALCIUM 8.9 04/09/2020 1627   PROT 6.4 (L) 07/27/2017 0616   ALBUMIN 4.1 07/27/2017 0616   AST 32 07/27/2017 0616   ALT 24 07/27/2017 0616   ALKPHOS 49 07/27/2017 0616   BILITOT 0.7 07/27/2017 0616   GFRNONAA >60 04/09/2020 1627   GFRAA >60 04/09/2020 1627   No results found for: CHOL, HDL, LDLCALC, LDLDIRECT, TRIG, CHOLHDL No results found for: WEXH3Z No results found for: VITAMINB12 No results found for: TSH     ASSESSMENT AND PLAN 24 y.o. year old male  has a past medical history of Allergic rhinitis, Anxiety, Bipolar 1 disorder (HCC), Bradycardia, Depression, PAC (premature atrial contraction), Palpitations, and Palpitations. here with ***  No diagnosis found.     No orders of the defined types were placed in this encounter.    No orders of the defined types were placed in this encounter.     I spent 15 minutes with the patient. 50% of this time was spent counseling and educating patient on plan of care and medications.    Shawnie Dapper, FNP-C 07/25/2020, 7:54 AM [facility] [address], [address] [place], [place] [address] [phone]

## 2020-07-26 DIAGNOSIS — M9901 Segmental and somatic dysfunction of cervical region: Secondary | ICD-10-CM | POA: Diagnosis not present

## 2020-07-26 DIAGNOSIS — M9902 Segmental and somatic dysfunction of thoracic region: Secondary | ICD-10-CM | POA: Diagnosis not present

## 2020-07-26 DIAGNOSIS — M9903 Segmental and somatic dysfunction of lumbar region: Secondary | ICD-10-CM | POA: Diagnosis not present

## 2020-07-26 DIAGNOSIS — R519 Headache, unspecified: Secondary | ICD-10-CM | POA: Diagnosis not present

## 2020-09-06 DIAGNOSIS — R1310 Dysphagia, unspecified: Secondary | ICD-10-CM | POA: Diagnosis not present

## 2020-10-05 DIAGNOSIS — F1210 Cannabis abuse, uncomplicated: Secondary | ICD-10-CM | POA: Diagnosis not present

## 2020-10-05 DIAGNOSIS — F411 Generalized anxiety disorder: Secondary | ICD-10-CM | POA: Diagnosis not present

## 2020-10-30 DIAGNOSIS — F411 Generalized anxiety disorder: Secondary | ICD-10-CM | POA: Diagnosis not present

## 2020-10-30 DIAGNOSIS — F1210 Cannabis abuse, uncomplicated: Secondary | ICD-10-CM | POA: Diagnosis not present

## 2020-11-14 DIAGNOSIS — F1210 Cannabis abuse, uncomplicated: Secondary | ICD-10-CM | POA: Diagnosis not present

## 2020-11-14 DIAGNOSIS — F411 Generalized anxiety disorder: Secondary | ICD-10-CM | POA: Diagnosis not present

## 2020-11-22 DIAGNOSIS — F331 Major depressive disorder, recurrent, moderate: Secondary | ICD-10-CM | POA: Diagnosis not present

## 2020-11-22 DIAGNOSIS — F411 Generalized anxiety disorder: Secondary | ICD-10-CM | POA: Diagnosis not present

## 2020-11-22 DIAGNOSIS — F1210 Cannabis abuse, uncomplicated: Secondary | ICD-10-CM | POA: Diagnosis not present

## 2020-11-28 DIAGNOSIS — F411 Generalized anxiety disorder: Secondary | ICD-10-CM | POA: Diagnosis not present

## 2020-11-28 DIAGNOSIS — F1210 Cannabis abuse, uncomplicated: Secondary | ICD-10-CM | POA: Diagnosis not present

## 2020-11-28 DIAGNOSIS — F331 Major depressive disorder, recurrent, moderate: Secondary | ICD-10-CM | POA: Diagnosis not present

## 2020-12-12 DIAGNOSIS — F1210 Cannabis abuse, uncomplicated: Secondary | ICD-10-CM | POA: Diagnosis not present

## 2020-12-12 DIAGNOSIS — F411 Generalized anxiety disorder: Secondary | ICD-10-CM | POA: Diagnosis not present

## 2020-12-12 DIAGNOSIS — F331 Major depressive disorder, recurrent, moderate: Secondary | ICD-10-CM | POA: Diagnosis not present

## 2020-12-20 DIAGNOSIS — Z79891 Long term (current) use of opiate analgesic: Secondary | ICD-10-CM | POA: Diagnosis not present

## 2020-12-20 DIAGNOSIS — F331 Major depressive disorder, recurrent, moderate: Secondary | ICD-10-CM | POA: Diagnosis not present

## 2020-12-20 DIAGNOSIS — F411 Generalized anxiety disorder: Secondary | ICD-10-CM | POA: Diagnosis not present

## 2020-12-20 DIAGNOSIS — F1210 Cannabis abuse, uncomplicated: Secondary | ICD-10-CM | POA: Diagnosis not present

## 2020-12-31 DIAGNOSIS — F331 Major depressive disorder, recurrent, moderate: Secondary | ICD-10-CM | POA: Diagnosis not present

## 2020-12-31 DIAGNOSIS — F411 Generalized anxiety disorder: Secondary | ICD-10-CM | POA: Diagnosis not present

## 2020-12-31 DIAGNOSIS — F1210 Cannabis abuse, uncomplicated: Secondary | ICD-10-CM | POA: Diagnosis not present

## 2021-08-02 DIAGNOSIS — R7309 Other abnormal glucose: Secondary | ICD-10-CM | POA: Diagnosis not present

## 2021-08-02 DIAGNOSIS — F319 Bipolar disorder, unspecified: Secondary | ICD-10-CM | POA: Diagnosis not present

## 2021-08-02 DIAGNOSIS — M549 Dorsalgia, unspecified: Secondary | ICD-10-CM | POA: Diagnosis not present

## 2021-08-02 DIAGNOSIS — M542 Cervicalgia: Secondary | ICD-10-CM | POA: Diagnosis not present

## 2021-08-12 ENCOUNTER — Telehealth: Payer: Self-pay
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
## 2021-08-12 NOTE — Telephone Encounter (Signed)
I am okay with the switch, I really do not have anything else to add and I had recommended orthopedic work-up for musculoskeletal pain. I had recommended this to be discussed with PCP.  In addition, I had made a referral to a concussion clinic.    Per new referral note, it mentions that patient did not have a good visit with me.  If Dr. Lucia Gaskins declines based on her assessment of the chart and the referral, I recommend PCP refer him to another office.

## 2021-12-13 DIAGNOSIS — R519 Headache, unspecified: Secondary | ICD-10-CM | POA: Diagnosis not present

## 2021-12-13 DIAGNOSIS — G8929 Other chronic pain: Secondary | ICD-10-CM | POA: Diagnosis not present

## 2021-12-13 DIAGNOSIS — M542 Cervicalgia: Secondary | ICD-10-CM | POA: Diagnosis not present

## 2021-12-13 DIAGNOSIS — F0781 Postconcussional syndrome: Secondary | ICD-10-CM | POA: Diagnosis not present

## 2021-12-13 DIAGNOSIS — M545 Low back pain, unspecified: Secondary | ICD-10-CM | POA: Diagnosis not present

## 2022-02-24 DIAGNOSIS — F99 Mental disorder, not otherwise specified: Secondary | ICD-10-CM | POA: Diagnosis not present

## 2022-02-24 DIAGNOSIS — R4189 Other symptoms and signs involving cognitive functions and awareness: Secondary | ICD-10-CM | POA: Diagnosis not present

## 2022-03-10 DIAGNOSIS — R4189 Other symptoms and signs involving cognitive functions and awareness: Secondary | ICD-10-CM | POA: Diagnosis not present

## 2022-07-09 DIAGNOSIS — F319 Bipolar disorder, unspecified: Secondary | ICD-10-CM | POA: Diagnosis not present

## 2022-07-09 DIAGNOSIS — R1310 Dysphagia, unspecified: Secondary | ICD-10-CM | POA: Diagnosis not present

## 2022-07-09 DIAGNOSIS — K219 Gastro-esophageal reflux disease without esophagitis: Secondary | ICD-10-CM | POA: Diagnosis not present

## 2022-07-29 ENCOUNTER — Other Ambulatory Visit: Payer: Self-pay

## 2022-07-29 DIAGNOSIS — Z713 Dietary counseling and surveillance: Secondary | ICD-10-CM | POA: Diagnosis not present

## 2022-08-12 DIAGNOSIS — Z713 Dietary counseling and surveillance: Secondary | ICD-10-CM | POA: Diagnosis not present

## 2022-09-16 DIAGNOSIS — Z713 Dietary counseling and surveillance: Secondary | ICD-10-CM | POA: Diagnosis not present

## 2022-10-24 DIAGNOSIS — F331 Major depressive disorder, recurrent, moderate: Secondary | ICD-10-CM | POA: Diagnosis not present

## 2022-10-24 DIAGNOSIS — F1210 Cannabis abuse, uncomplicated: Secondary | ICD-10-CM | POA: Diagnosis not present

## 2022-10-24 DIAGNOSIS — H9403 Acoustic neuritis in infectious and parasitic diseases classified elsewhere, bilateral: Secondary | ICD-10-CM | POA: Diagnosis not present

## 2022-10-24 DIAGNOSIS — F4000 Agoraphobia, unspecified: Secondary | ICD-10-CM | POA: Diagnosis not present

## 2022-10-24 DIAGNOSIS — Z79899 Other long term (current) drug therapy: Secondary | ICD-10-CM | POA: Diagnosis not present

## 2022-11-17 DIAGNOSIS — F331 Major depressive disorder, recurrent, moderate: Secondary | ICD-10-CM | POA: Diagnosis not present

## 2022-11-17 DIAGNOSIS — F4000 Agoraphobia, unspecified: Secondary | ICD-10-CM | POA: Diagnosis not present

## 2022-11-17 DIAGNOSIS — F411 Generalized anxiety disorder: Secondary | ICD-10-CM | POA: Diagnosis not present

## 2022-12-03 DIAGNOSIS — K449 Diaphragmatic hernia without obstruction or gangrene: Secondary | ICD-10-CM | POA: Diagnosis not present

## 2022-12-03 DIAGNOSIS — K5900 Constipation, unspecified: Secondary | ICD-10-CM | POA: Diagnosis not present

## 2022-12-03 DIAGNOSIS — K219 Gastro-esophageal reflux disease without esophagitis: Secondary | ICD-10-CM | POA: Diagnosis not present

## 2024-03-10 ENCOUNTER — Encounter: Payer: Self-pay | Admitting: Family Medicine

## 2024-03-10 ENCOUNTER — Other Ambulatory Visit: Payer: Self-pay | Admitting: Family Medicine

## 2024-03-10 DIAGNOSIS — M7989 Other specified soft tissue disorders: Secondary | ICD-10-CM

## 2024-03-15 ENCOUNTER — Ambulatory Visit
Admission: RE | Admit: 2024-03-15 | Discharge: 2024-03-15 | Disposition: A | Discharge status: Home / Self Care | Source: Ambulatory Visit | Attending: Family Medicine | Admitting: Family Medicine

## 2024-03-15 DIAGNOSIS — M7989 Other specified soft tissue disorders: Secondary | ICD-10-CM
